# Patient Record
Sex: Male | Born: 1980 | Hispanic: Yes | Smoking: Current every day smoker
Health system: Southern US, Community
[De-identification: ages and names within clinical notes are randomized; demographics above are authoritative.]

## PROBLEM LIST (undated history)

## (undated) ENCOUNTER — Ambulatory Visit: Payer: Self-pay | Source: Home / Self Care

## (undated) HISTORY — PX: NO PAST SURGERIES: SHX2092

---

## 2013-08-03 ENCOUNTER — Emergency Department: Payer: Self-pay | Admitting: Emergency Medicine

## 2013-08-05 ENCOUNTER — Emergency Department: Payer: Self-pay | Admitting: Internal Medicine

## 2013-08-10 ENCOUNTER — Emergency Department: Payer: Self-pay | Admitting: Emergency Medicine

## 2014-12-07 ENCOUNTER — Emergency Department: Payer: Self-pay

## 2014-12-07 ENCOUNTER — Emergency Department
Admission: EM | Admit: 2014-12-07 | Discharge: 2014-12-07 | Disposition: A | Discharge status: Home / Self Care | Payer: Self-pay | Attending: Emergency Medicine | Admitting: Emergency Medicine

## 2014-12-07 ENCOUNTER — Encounter: Payer: Self-pay | Admitting: *Deleted

## 2014-12-07 DIAGNOSIS — S39011A Strain of muscle, fascia and tendon of abdomen, initial encounter: Secondary | ICD-10-CM | POA: Insufficient documentation

## 2014-12-07 DIAGNOSIS — Y9289 Other specified places as the place of occurrence of the external cause: Secondary | ICD-10-CM | POA: Insufficient documentation

## 2014-12-07 DIAGNOSIS — X58XXXA Exposure to other specified factors, initial encounter: Secondary | ICD-10-CM | POA: Insufficient documentation

## 2014-12-07 DIAGNOSIS — Y9389 Activity, other specified: Secondary | ICD-10-CM | POA: Insufficient documentation

## 2014-12-07 DIAGNOSIS — R1030 Lower abdominal pain, unspecified: Secondary | ICD-10-CM

## 2014-12-07 DIAGNOSIS — Y998 Other external cause status: Secondary | ICD-10-CM | POA: Insufficient documentation

## 2014-12-07 DIAGNOSIS — T148XXA Other injury of unspecified body region, initial encounter: Secondary | ICD-10-CM

## 2014-12-07 DIAGNOSIS — K5731 Diverticulosis of large intestine without perforation or abscess with bleeding: Secondary | ICD-10-CM

## 2014-12-07 DIAGNOSIS — Z720 Tobacco use: Secondary | ICD-10-CM | POA: Insufficient documentation

## 2014-12-07 LAB — URINALYSIS COMPLETE WITH MICROSCOPIC (ARMC ONLY)
BILIRUBIN URINE: NEGATIVE
Bacteria, UA: NONE SEEN
Glucose, UA: 50 mg/dL — AB
HGB URINE DIPSTICK: NEGATIVE
KETONES UR: NEGATIVE mg/dL
Leukocytes, UA: NEGATIVE
Nitrite: NEGATIVE
PROTEIN: NEGATIVE mg/dL
RBC / HPF: NONE SEEN RBC/hpf (ref 0–5)
SPECIFIC GRAVITY, URINE: 1.009 (ref 1.005–1.030)
Squamous Epithelial / LPF: NONE SEEN
WBC, UA: NONE SEEN WBC/hpf (ref 0–5)
pH: 6 (ref 5.0–8.0)

## 2014-12-07 LAB — COMPREHENSIVE METABOLIC PANEL
ALBUMIN: 4.6 g/dL (ref 3.5–5.0)
ALK PHOS: 78 U/L (ref 38–126)
ALT: 78 U/L — AB (ref 17–63)
AST: 60 U/L — ABNORMAL HIGH (ref 15–41)
Anion gap: 8 (ref 5–15)
BILIRUBIN TOTAL: 0.6 mg/dL (ref 0.3–1.2)
BUN: 11 mg/dL (ref 6–20)
CALCIUM: 9.5 mg/dL (ref 8.9–10.3)
CO2: 25 mmol/L (ref 22–32)
CREATININE: 0.83 mg/dL (ref 0.61–1.24)
Chloride: 106 mmol/L (ref 101–111)
Glucose, Bld: 113 mg/dL — ABNORMAL HIGH (ref 65–99)
Potassium: 3.9 mmol/L (ref 3.5–5.1)
SODIUM: 139 mmol/L (ref 135–145)
TOTAL PROTEIN: 7.9 g/dL (ref 6.5–8.1)

## 2014-12-07 LAB — CBC WITH DIFFERENTIAL/PLATELET
Basophils Absolute: 0 10*3/uL (ref 0–0.1)
Basophils Relative: 1 %
EOS PCT: 5 %
Eosinophils Absolute: 0.3 10*3/uL (ref 0–0.7)
HCT: 44.5 % (ref 40.0–52.0)
HEMOGLOBIN: 15.6 g/dL (ref 13.0–18.0)
LYMPHS PCT: 38 %
Lymphs Abs: 2.5 10*3/uL (ref 1.0–3.6)
MCH: 32 pg (ref 26.0–34.0)
MCHC: 35.2 g/dL (ref 32.0–36.0)
MCV: 90.9 fL (ref 80.0–100.0)
Monocytes Absolute: 0.6 10*3/uL (ref 0.2–1.0)
Monocytes Relative: 9 %
Neutro Abs: 3.2 10*3/uL (ref 1.4–6.5)
Neutrophils Relative %: 47 %
Platelets: 253 10*3/uL (ref 150–440)
RBC: 4.89 MIL/uL (ref 4.40–5.90)
RDW: 12.1 % (ref 11.5–14.5)
WBC: 6.6 10*3/uL (ref 3.8–10.6)

## 2014-12-07 LAB — LIPASE, BLOOD: LIPASE: 38 U/L (ref 22–51)

## 2014-12-07 MED ORDER — SODIUM CHLORIDE 0.9 % IV BOLUS (SEPSIS)
1000.0000 mL | INTRAVENOUS | Status: AC
  Administered 2014-12-07: 1000 mL via INTRAVENOUS

## 2014-12-07 MED ORDER — IOHEXOL 240 MG/ML SOLN
25.0000 mL | Freq: Once | INTRAMUSCULAR | Status: AC | PRN
  Administered 2014-12-07: 25 mL via ORAL
  Filled 2014-12-07: qty 25

## 2014-12-07 MED ORDER — ONDANSETRON HCL 4 MG/2ML IJ SOLN
4.0000 mg | INTRAMUSCULAR | Status: AC
  Administered 2014-12-07: 4 mg via INTRAVENOUS
  Filled 2014-12-07: qty 2

## 2014-12-07 MED ORDER — IOHEXOL 300 MG/ML  SOLN
100.0000 mL | Freq: Once | INTRAMUSCULAR | Status: AC | PRN
  Administered 2014-12-07: 100 mL via INTRAVENOUS
  Filled 2014-12-07: qty 100

## 2014-12-07 MED ORDER — MORPHINE SULFATE 4 MG/ML IJ SOLN
4.0000 mg | Freq: Once | INTRAMUSCULAR | Status: AC
  Administered 2014-12-07: 4 mg via INTRAVENOUS
  Filled 2014-12-07: qty 1

## 2014-12-07 NOTE — ED Provider Notes (Signed)
[facility] Emergency Department Provider Note  ____________________________________________  Time seen: Approximately 3:49 PM  I have reviewed the triage vital signs and the nursing notes.   HISTORY  Chief Complaint Abdominal Pain  This patient speaks Spanish and the history and physical was done with the assistance of the hospital Spanish interpreter  HPI Samuel Fleming is a 34 y.o. male who presents with lower abdominal pain.He has had pain for about 2 weeks after lifting some heavy boxes at work.  He describes it primarily in his left flank in his lower abdomen.  It was gradual in onset, persistent, and a dull ache that is worsened by movement and straining.  It got significantly worse today, however, and is now described as sharp and stabbing and severe and located primarily in his lower abdomen on both sides but worse on the left.  Movement and straining make it worse and nothing makes it better.  He endorses recent constipation where he has to strain hard to get out his bowel movement has noted some blood on the toilet paper when he wipes but no blood inside the stool.  He has not had any difficulty urinating but he describes his urine is foul-smelling.  He has not had any urinary incontinence.  He denies any penile or testicular/scrotal pain.  He denies nausea/vomiting, chest pain, shortness of breath, fever/chills, and dysuria.   History reviewed. No pertinent past medical history.  There are no active problems to display for this patient.   No past surgical history on file.  No current outpatient prescriptions on file.  Allergies Review of patient's allergies indicates no known allergies.  No family history on file.  Social History History  Substance Use Topics  . Smoking status: Current Every Day Smoker  . Smokeless tobacco: Not on file  . Alcohol Use: No    Review of Systems Constitutional: No fever/chills Eyes: No visual  changes. ENT: No sore throat. Cardiovascular: Denies chest pain. Respiratory: Denies shortness of breath. Gastrointestinal: Lower abdominal pain worsen the left.  No nausea, no vomiting.  No diarrhea.  Occasional constipation. Genitourinary: Negative for dysuria.  Endorses foul-smelling urine Musculoskeletal: Left flank pain Skin: Negative for rash. Neurological: Negative for headaches, focal weakness or numbness.  10-point ROS otherwise negative.  ____________________________________________   PHYSICAL EXAM:  VITAL SIGNS: ED Triage Vitals  Enc Vitals Group     BP 12/07/14 1529 137/70 mmHg     Pulse Rate 12/07/14 1540 79     Resp 12/07/14 1529 20     Temp 12/07/14 1529 99.1 F (37.3 C)     Temp Source 12/07/14 1529 Oral     SpO2 12/07/14 1540 97 %     Weight 12/07/14 1529 170 lb (77.111 kg)     Height 12/07/14 1529  (1.676 m)     Head Cir --      Peak Flow --      Pain Score 12/07/14 1530 8     Pain Loc --      Pain Edu? --      Excl. in GC? --     Constitutional: Alert and oriented.  Appears uncomfortable but is in no acute distress. Eyes: Conjunctivae are normal. PERRL. EOMI. Head: Atraumatic. Nose: No congestion/rhinnorhea. Mouth/Throat: Mucous membranes are moist.  Oropharynx non-erythematous. Neck: No stridor.   Cardiovascular: Normal rate, regular rhythm. Grossly normal heart sounds.  Good peripheral circulation. Respiratory: Normal respiratory effort.  No retractions. Lungs CTAB. Gastrointestinal: Soft with  moderate tenderness to palpation of his left lower quadrant and suprapubic areas. No distention.  No rebound or guarding.  No abdominal bruits.  Moderate left CVA tenderness, not on the right.  Deferred rectal exam. Musculoskeletal: No lower extremity tenderness nor edema.  No joint effusions. Neurologic:  Normal speech and language. No gross focal neurologic deficits are appreciated.  Skin:  Skin is warm, dry and intact. No rash noted. Psychiatric:  Mood and affect are normal. Speech and behavior are normal.  ____________________________________________   LABS (all labs ordered are listed, but only abnormal results are displayed)  Labs Reviewed  URINALYSIS COMPLETEWITH MICROSCOPIC ([facility] ONLY) - Abnormal; Notable for the following:    Color, Urine STRAW (*)    APPearance CLEAR (*)    Glucose, UA 50 (*)    All other components within normal limits  COMPREHENSIVE METABOLIC PANEL - Abnormal; Notable for the following:    Glucose, Bld 113 (*)    AST 60 (*)    ALT 78 (*)    All other components within normal limits  CBC WITH DIFFERENTIAL/PLATELET  LIPASE, BLOOD   ____________________________________________  EKG  Not indicated ____________________________________________  RADIOLOGY  Ct Abdomen Pelvis W Contrast  12/07/2014   CLINICAL DATA:  34 year old male with heavy lifting 2 weeks ago and subsequent pain radiating to the bilateral lower abdomen. Rectal bleeding with bowel movements and foul smelling urine. Lower abdominal pain. Initial encounter.  EXAM: CT ABDOMEN AND PELVIS WITH CONTRAST  TECHNIQUE: Multidetector CT imaging of the abdomen and pelvis was performed using the standard protocol following bolus administration of intravenous contrast.  CONTRAST:  OMNIPAQUE IOHEXOL 300 MG/ML  SOLN  COMPARISON:  None.  FINDINGS: Negative lung bases aside from mild scarring in the medial segment of the right middle lobe (series 4, image 13).  No pericardial or pleural effusion.  No acute osseous abnormality identified. Vacuum disc phenomena in the lower thoracic spine. Mild lower lumbar disc bulging.  No pelvic free fluid. Decompressed distal colon. Unremarkable urinary bladder. No perivesical stranding.  Decompressed sigmoid colon and left colon. Occasional left colon diverticula. Negative transverse colon. There are some diverticula at the hepatic flexure. There is also mild to moderate diverticulosis of the right colon. No  associated inflammation. Negative appendix. Negative terminal ileum. No dilated small bowel. Negative stomach and duodenum. Oral contrast has not yet reached the mid small bowel.  Mildly decreased density in the liver. Otherwise liver, gallbladder, spleen, pancreas, and adrenal glands are within normal limits. Portal venous system is patent. Major arterial structures are patent and appear normal. No abdominal free fluid. No lymphadenopathy. Both kidneys appear normal. No para renal stranding. Negative course of both ureters. No periureteral stranding. Left hemipelvis phlebolith.  IMPRESSION: Right greater than left large bowel diverticulosis with no active inflammation.  Normal appendix. No acute or inflammatory process identified in the abdomen or pelvis.   Electronically Signed   By: Odessa Fleming M.D.   On: 12/07/2014 17:00    ____________________________________________   PROCEDURES  Procedure(s) performed: None  Critical Care performed: No ____________________________________________   INITIAL IMPRESSION / ASSESSMENT AND PLAN / ED COURSE  Pertinent labs & imaging results that were available during my care of the patient were reviewed by me and considered in my medical decision making (see chart for details).  The patient is young for diverticulitis, but his history and presentation is most consistent with this diagnosis.  Hernia is also possible but not present on physical exam.  He may have  strained his back at work, but this does not explain his abdominal tenderness.  He has no neurological deficits, and in spite of some recent constipation, he has no evidence of cauda equina syndrome.  He is ambulating without difficulty and has no saddle anesthesia, urinary retention nor incontinence.  His back pain is in the left lateral flank.  Nephrolithiasis or ureterolithiasis is also possible.  Given his significant tenderness to palpation, I will evaluate with a CT scan with contrast.  I am giving him  some IV fluids, morphine, and Zofran.  I discussed this plan with the patient and his wife and they understand and agree with the plan.  ----------------------------------------- 5:43 PM on 12/07/2014 -----------------------------------------  The patient's workup is unremarkable.  He has diverticulosis on the CT scan but no evidence of diverticulitis.  Given the diverticulosis on the scan I deferred a rectal exam.  He has no other acute findings and his lab work is all unremarkable.  He states that he is feeling better after the IV fluids, morphine, and Zofran.  He has wife seem reassured by my report of his results.  I also explained that I personally reviewed the images of the CT scan and looked at his spine and also saw no evidence of acute abnormalities in his spine.  Explained he is likely suffering from some musculoskeletal strain due to the heavy lifting he does at work.  I advised him to establish a primary care doctor but return to the emergency department if he develops new or worsening symptoms that concern him.  He and his wife understand and agree.  ____________________________________________  FINAL CLINICAL IMPRESSION(S) / ED DIAGNOSES  Final diagnoses:  Muscle strain  Lower abdominal pain  Diverticulosis large intestine w/o perforation or abscess w/bleeding      NEW MEDICATIONS STARTED DURING THIS VISIT:  New Prescriptions   No medications on file     Loleta Roseory Tulio Facundo, MD 12/07/14 1747

## 2014-12-07 NOTE — ED Notes (Signed)
Per Interpreter patient lifting heavy boxes at work 15 days ago and has pain since then to right and left lower abdomen.  Per interpreter patient is having bleeding per rectum when having bowel movement and his urine smells bad when urinating.

## 2014-12-07 NOTE — ED Notes (Addendum)
Pt reports he has low abd pain.  No n/v/d.  Pt states it feels like a body aches.  Sx began 15 days, pain worse today.   Pt also reports lower back pain.  No diff urinating. Interpreter in with pt in triage.

## 2014-12-07 NOTE — Discharge Instructions (Signed)
You have been seen in the Emergency Department (ED) for abdominal pain.  Your evaluation did not identify a clear cause of your symptoms but was generally reassuring.  Please follow up as instructed above regarding todays emergent visit and the symptoms that are bothering you.  Return to the ED if your abdominal pain worsens or fails to improve, you develop bloody vomiting, bloody diarrhea, you are unable to tolerate fluids due to vomiting, fever greater than 101, or other symptoms that concern you.   Distensin muscular. (Muscle Strain) Una distensin muscular es una lesin que se produce cuando un msculo se estira ms all de su largo normal. Cuando esto sucede, por lo general se desgarra un pequeo nmero de fibras musculares. La distensin muscular se califica en grados. Las distensiones de Museum/gallery conservatorprimer grado son aquellas en las cuales el desgarro y el dolor afectan a la menor cantidad de fibras musculares. Las distensiones de segundo y tercer grado involucran una proporcin cada vez mayor de desgarro y Engineer, miningdolor.  En general, la recuperacin de una distensin muscular tarda de 1 a 2semanas. La curacin completa tarda de 5 a 6semanas.  CAUSAS  Las distensiones musculares ocurren cuando se aplica una fuerza violenta y repentina sobre un msculo y este se estira demasiado. Esto puede ocurrir cuando se Hydrographic surveyorlevantan objetos, se practican deportes o en una cada.  FACTORES DE RIESGO La distensin muscular es especialmente comn en los atletas.  SIGNOS Y SNTOMAS En el lugar de la distensin muscular se puede presentar lo siguiente:  Dolor.  Moretones.  Hinchazn.  Dificultad para usar el msculo debido al dolor o a un funcionamiento anormal. DIAGNSTICO  El mdico le har un examen fsico y le har preguntas sobre sus antecedentes mdicos. TRATAMIENTO  Con frecuencia, el mejor tratamiento para una distensin muscular es el reposo, y la aplicacin de hielo y de compresas fras en la zona de la  lesin.  INSTRUCCIONES PARA EL CUIDADO EN EL HOGAR   Use el mtodo PRICE (por sus siglas en ingls) de tratamiento para estimular la curacin durante los primeros 2 a 3das posteriores a la lesin. El mtodo PRICE implica lo siguiente:  Proteger al msculo de nuevas lesiones.  Limitar la actividad y Lawyerdescansar la parte del cuerpo lesionada.  Aplicar hielo a la lesin. Para hacerlo, ponga hielo en una bolsa plstica. Coloque una toalla entre la piel y la bolsa de hielo. Luego aplique el hielo y djelo actuar de 15 a 20minutos por hora. Despus del Press photographertercer da, cambie a compresas de calor hmedo.  Comprimir la zona lesionada con una frula o venda elstica. Tenga cuidado de no ajustarla demasiado. Esto puede interferir con la circulacin sangunea o aumentar la hinchazn.  Mantener la zona lesionada por encima del nivel del corazn con la mayor frecuencia posible.  Utilice los medicamentos de venta libre o recetados para Primary school teachercalmar el dolor, el malestar o la fiebre, segn se lo indique el mdico.  Education officer, environmentalealizar un calentamiento antes de hacer ejercicio ayuda a prevenir distensiones musculares futuras. SOLICITE ATENCIN MDICA SI:   Siente un dolor cada vez ms intenso o hinchazn en la zona lesionada.  Siente adormecimiento, hormigueo o nota una prdida importante de fuerza en la zona lesionada. ASEGRESE DE QUE:   Comprende estas instrucciones.  Controlar su afeccin.  Recibir ayuda de inmediato si no mejora o si empeora. Document Released: 02/13/2005 Document Revised: 02/24/2013 [employer] Patient Information 2015 [place], [place]. This information is not intended to replace advice given to you by your health care  provider. Make sure you discuss any questions you have with your health care provider.  Diverticulosis (Diverticulosis) La diverticulosis es una enfermedad que aparece cuando se forman pequeos bolsillos (divertculos) en las paredes del colon. El colon, o intestino grueso, es el  lugar donde se absorbe agua y se forman las heces. Los bolsillos se forman cuando la capa interna del colon ejerce presin sobre los puntos dbiles de las capas externas. CAUSAS  Nadie sabe con exactitud qu causa la diverticulosis. FACTORES DE RIESGO  Ser mayor de 76aos. El riesgo de desarrollar esta enfermedad aumenta con la edad. La diverticulosis es poco frecuente en las personas menores de [place]. A los 80aos, casi todas las personas tienen la enfermedad.  Comer una dieta con bajo contenido de [place].  Estar estreido con frecuencia.  Tener sobrepeso.  No hacer suficiente ejercicio fsico.  Fumar.  Tomar analgsicos de [address], como aspirina e ibuprofeno. SNTOMAS  La mayora de las personas que tienen diverticulosis no presentan sntomas. DIAGNSTICO  Dado que la diverticulosis no suele causar sntomas, los mdicos a menudo descubren la enfermedad durante un examen de otros problemas de colon. En muchos casos, el mdico diagnosticar la diverticulosis mientras utiliza un endoscopio flexible para examinar el colon (colonoscopa). TRATAMIENTO  Si nunca tuvo una infeccin relacionada con la diverticulosis, es posible que no necesite tratamiento. Si ha tenido una infeccin antes, el tratamiento puede incluir:  Comer ms frutas, verduras y cereales.  Tomar un suplemento de [place].  Tomar un suplemento de bacterias vivas (probitico).  Tomar medicamentos para relajar el colon. INSTRUCCIONES PARA EL CUIDADO EN EL HOGAR   Beba por lo menos entre 6 y 8vasos de agua por da para Banker.  Trate de no hacer fuerza al mover el intestino.  Cumpla con todas las visitas de control. Si ha tenido una infeccin antes:   Aumente la cantidad de fibra en la dieta, segn las indicaciones del mdico o del nutricionista.  Tome un suplemento dietario con fibras si el mdico lo autoriza.  Tome los medicamentos solamente como se lo haya indicado el mdico. SOLICITE  ATENCIN MDICA SI:   Siente dolor abdominal.  Tiene meteorismo.  Tiene clicos.  No ha defecado en 3das. SOLICITE ATENCIN MDICA DE INMEDIATO SI:   El dolor empeora.  El meteorismo [employer].  Tiene fiebre o escalofros, y los sntomas empeoran repentinamente.  Comienza a vomitar.  La materia fecal es sanguinolenta o negra. ASEGRESE DE QUE:  Comprende estas instrucciones.  Controlar su afeccin.  Recibir ayuda de inmediato si no mejora o si empeora. Document Released: 04/18/2008 Document Revised: 05/11/2013 [facility] Patient Information 2015 [place], [place]. This information is not intended to replace advice given to you by your health care provider. Make sure you discuss any questions you have with your health care provider.

## 2018-02-16 LAB — HM HIV SCREENING LAB: HM HIV Screening: NEGATIVE

## 2019-09-23 ENCOUNTER — Ambulatory Visit: Payer: Self-pay | Admitting: Family Medicine

## 2019-09-23 ENCOUNTER — Other Ambulatory Visit: Payer: Self-pay

## 2019-09-23 ENCOUNTER — Encounter: Payer: Self-pay | Admitting: Family Medicine

## 2019-09-23 DIAGNOSIS — N341 Nonspecific urethritis: Secondary | ICD-10-CM

## 2019-09-23 DIAGNOSIS — L918 Other hypertrophic disorders of the skin: Secondary | ICD-10-CM

## 2019-09-23 DIAGNOSIS — Z113 Encounter for screening for infections with a predominantly sexual mode of transmission: Secondary | ICD-10-CM

## 2019-09-23 LAB — GRAM STAIN

## 2019-09-23 MED ORDER — AZITHROMYCIN 500 MG PO TABS
1000.0000 mg | ORAL_TABLET | Freq: Once | ORAL | Status: AC
  Administered 2019-09-23: 10:00:00 1000 mg via ORAL

## 2019-09-23 NOTE — Progress Notes (Signed)
Gram stain reviewed; pt treated for NGU per provider diagnosis and order. Provider orders completed. Instructions reviewed with pt about pt and partner tx.

## 2019-09-23 NOTE — Progress Notes (Signed)
[facility] Department STI clinic/screening visit  Subjective:  Samuel Fleming is a 39 y.o. male being seen today for  Chief Complaint  Patient presents with  . SEXUALLY TRANSMITTED DISEASE    STD screening including bloodwork     The patient reports they do have symptoms.   Patient has the following medical conditions:  There are no problems to display for this patient.   HPI  Pt reports lower abd pain, burning w/urination and fever x1 wk. Past 2 days he took 2 pills of penicillin, possibly helped fever. Had a negative covid test. Pt urinated ~1 hr ago.  Also reports reoccurrence of skin growths on hips, has had these frozen in the past, requests freezing again today.    See flowsheet for further details and programmatic requirements.    No components found for: HCV  The following portions of the patient's history were reviewed and updated as appropriate: allergies, current medications, past medical history, past social history, past surgical history and problem list.  Objective:  There were no vitals filed for this visit.   Physical Exam Constitutional:      Appearance: Normal appearance.  HENT:     Head: Normocephalic and atraumatic.     Comments: No nits or hair loss    Mouth/Throat:     Mouth: Mucous membranes are moist.     Pharynx: Oropharynx is clear. No oropharyngeal exudate or posterior oropharyngeal erythema.  Pulmonary:     Effort: Pulmonary effort is normal.  Abdominal:     General: Abdomen is flat.     Palpations: Abdomen is soft. There is no hepatomegaly or mass.     Tenderness: There is no abdominal tenderness.  Genitourinary:    Pubic Area: No rash or pubic lice.      Penis: Normal and uncircumcised.      Testes: Normal.     Epididymis:     Right: Normal.     Left: Normal.     Rectum: Normal.    Lymphadenopathy:     Head:     Right side of head: No preauricular or posterior auricular adenopathy.     Left side of head: No  preauricular or posterior auricular adenopathy.     Cervical: No cervical adenopathy.     Upper Body:     Right upper body: No supraclavicular or axillary adenopathy.     Left upper body: No supraclavicular or axillary adenopathy.     Lower Body: No right inguinal adenopathy. No left inguinal adenopathy.  Skin:    General: Skin is warm and dry.     Findings: No rash.  Neurological:     Mental Status: He is alert and oriented to person, place, and time.       Assessment and Plan:  Samuel Fleming is a 39 y.o. male presenting to the [facility] Department for STI screening    1. Screening examination for venereal disease -Pt with symptoms. Screenings today as below. Treat wet prep per standing order. -Patient does not meet criteria for HepB, HepC Screening.  -Counseled on warning s/sx and when to seek care. Recommended condom use with all sex and discussed importance of condom use for STI prevention. - Gram stain - HIV [place] [facility] - Syphilis Serology, [facility] - Gonococcus culture  2. NGU (nongonococcal urethritis) - presumptive -Will treat symptoms presumptively as NGU today.  -Pt counseled regarding medication, including to RTC if vomits < 2 hr after  taking medicine. No known allergies to this medication. -Advised no sex for 7 days after both pt and partner completes treatment and encouraged condoms with all sex. - azithromycin (ZITHROMAX) tablet 1,000 mg  3. Skin tag -Cryo treatment in 3 freeze/thaw cycles today.  Patient tolerated procedure well. -Reviewed with patient after-care instructions and when to call clinic. -No sex until area has completely healed. -RTC in 10-14 days for next treatment if needed.    Return if symptoms worsen or fail to improve.  No future appointments.  Ann Held, PA-C

## 2019-09-27 LAB — GONOCOCCUS CULTURE

## 2020-09-22 ENCOUNTER — Other Ambulatory Visit: Payer: Self-pay

## 2020-09-22 ENCOUNTER — Ambulatory Visit
Admission: EM | Admit: 2020-09-22 | Discharge: 2020-09-22 | Disposition: A | Discharge status: Home / Self Care | Payer: Self-pay | Attending: Emergency Medicine | Admitting: Emergency Medicine

## 2020-09-22 DIAGNOSIS — U071 COVID-19: Secondary | ICD-10-CM | POA: Insufficient documentation

## 2020-09-22 DIAGNOSIS — R509 Fever, unspecified: Secondary | ICD-10-CM | POA: Insufficient documentation

## 2020-09-22 LAB — COMPREHENSIVE METABOLIC PANEL
ALT: 154 U/L — ABNORMAL HIGH (ref 0–44)
AST: 88 U/L — ABNORMAL HIGH (ref 15–41)
Albumin: 4.9 g/dL (ref 3.5–5.0)
Alkaline Phosphatase: 82 U/L (ref 38–126)
Anion gap: 5 (ref 5–15)
BUN: 10 mg/dL (ref 6–20)
CO2: 28 mmol/L (ref 22–32)
Calcium: 9.4 mg/dL (ref 8.9–10.3)
Chloride: 104 mmol/L (ref 98–111)
Creatinine, Ser: 0.77 mg/dL (ref 0.61–1.24)
GFR, Estimated: >60 mL/min (ref >60)
Glucose, Bld: 102 mg/dL — ABNORMAL HIGH (ref 70–99)
Potassium: 4.3 mmol/L (ref 3.5–5.1)
Sodium: 137 mmol/L (ref 135–145)
Total Bilirubin: 0.5 mg/dL (ref 0.3–1.2)
Total Protein: 8.4 g/dL — ABNORMAL HIGH (ref 6.5–8.1)

## 2020-09-22 LAB — CBC WITH DIFFERENTIAL/PLATELET
Abs Immature Granulocytes: 0.01 10*3/uL (ref 0.00–0.07)
Basophils Absolute: 0 10*3/uL (ref 0.0–0.1)
Basophils Relative: 1 %
Eosinophils Absolute: 0.1 10*3/uL (ref 0.0–0.5)
Eosinophils Relative: 1 %
HCT: 48.4 % (ref 39.0–52.0)
Hemoglobin: 16.5 g/dL (ref 13.0–17.0)
Immature Granulocytes: 0 %
Lymphocytes Relative: 35 %
Lymphs Abs: 1.6 10*3/uL (ref 0.7–4.0)
MCH: 31.6 pg (ref 26.0–34.0)
MCHC: 34.1 g/dL (ref 30.0–36.0)
MCV: 92.7 fL (ref 80.0–100.0)
Monocytes Absolute: 0.7 10*3/uL (ref 0.1–1.0)
Monocytes Relative: 15 %
Neutro Abs: 2.2 10*3/uL (ref 1.7–7.7)
Neutrophils Relative %: 48 %
Platelets: 214 10*3/uL (ref 150–400)
RBC: 5.22 MIL/uL (ref 4.22–5.81)
RDW: 11.8 % (ref 11.5–15.5)
WBC: 4.5 10*3/uL (ref 4.0–10.5)
nRBC: 0 % (ref 0.0–0.2)

## 2020-09-22 LAB — RAPID INFLUENZA A&B ANTIGENS
Influenza A (ARMC): NEGATIVE
Influenza B (ARMC): NEGATIVE

## 2020-09-22 LAB — OCCULT BLOOD X 1 CARD TO LAB, STOOL: Fecal Occult Bld: NEGATIVE

## 2020-09-22 NOTE — ED Provider Notes (Signed)
[facility]    CSN: [id number] Arrival date & time: 09/22/20  0948      History   Chief Complaint Chief Complaint  Patient presents with  . Fever    HPI Samuel Fleming is a 40 y.o. male.   HPI   40 year old Spanish-speaking male here for evaluation of fever.  Patient reports that he has been experiencing a subjective fever for the past 4 days and also sweats.  He has not measured his temperature at home.  Additionally, patient is complaining of pain in his upper back.  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  Patient is also concerned that he might have hemorrhoids because he has bleeding every time he has a bowel movement and he states he feels like something comes out that he has to push back in.  He states that he does have to strain significantly to have a bowel movement and that his stool comes out and strips but is otherwise normal consistency.  There is no history of colon cancer in the family.  Patient denies runny nose, sore throat, sinus pain or pressure, cough, shortness of breath or wheezing, abdominal pain, nausea, vomiting, diarrhea, or constipation.  Patient denies any urinary signs or symptoms.  Patient denies any blood in his urine or rectal itching.  Spanish interpreter Madaline Guthrie [phone] used for HPI and assessment.  History reviewed. No pertinent past medical history.  There are no problems to display for this patient.   Past Surgical History:  Procedure Laterality Date  . NO PAST SURGERIES         Home Medications    Prior to Admission medications   Not on File    Family History Family History  Problem Relation Age of Onset  . Healthy Mother   . Prostate cancer Father     Social History Social History   Tobacco Use  . Smoking status: Current Every Day Smoker    Packs/day: 0.25  . Smokeless tobacco: Current User   Vaping Use  . Vaping Use: Some days  Substance Use Topics  . Alcohol use: Yes    Comment: occasionally  . Drug use: Never     Allergies   Patient has no known allergies.   Review of Systems Review of Systems  Constitutional: Positive for fever. Negative for activity change and appetite change.  HENT: Negative for ear pain, rhinorrhea, sinus pressure and sore throat.   Respiratory: Negative for cough, shortness of breath and wheezing.   Gastrointestinal: Positive for anal bleeding. Negative for abdominal pain, constipation, diarrhea, nausea, rectal pain and vomiting.  Genitourinary: Negative for dysuria, frequency, hematuria and urgency.  Musculoskeletal: Positive for back pain.  Skin: Negative for rash.  Neurological: Negative for headaches.  Psychiatric/Behavioral: Negative.      Physical Exam Triage Vital Signs ED Triage Vitals [09/22/20 1005]  Enc Vitals Group     BP 135/84     Pulse Rate 80     Resp 18     Temp 99.4 F (37.4 C)     Temp Source Oral     SpO2 100 %     Weight 189 lb (85.7 kg)     Height      Head Circumference      Peak Flow      Pain Score 8     Pain Loc  Pain Edu?      Excl. in GC?    No data found.  Updated Vital Signs BP 135/84 (BP Location: Left Arm)   Pulse 80   Temp 99.4 F (37.4 C) (Oral)   Resp 18   Wt 189 lb (85.7 kg)   SpO2 100%   BMI 30.51 kg/m   Visual Acuity Right Eye Distance:   Left Eye Distance:   Bilateral Distance:    Right Eye Near:   Left Eye Near:    Bilateral Near:     Physical Exam Vitals and nursing note reviewed.  Constitutional:      General: He is not in acute distress.    Appearance: Normal appearance. He is normal weight.  HENT:     Head: Normocephalic and atraumatic.     Right Ear: Tympanic membrane, ear canal and external ear normal. There is no impacted cerumen.     Left Ear: Tympanic membrane, ear canal and external ear normal.     Nose: Congestion present.     Mouth/Throat:      Mouth: Mucous membranes are moist.     Pharynx: Oropharynx is clear. No oropharyngeal exudate or posterior oropharyngeal erythema.  Cardiovascular:     Rate and Rhythm: Normal rate and regular rhythm.     Pulses: Normal pulses.     Heart sounds: Normal heart sounds. No murmur heard. No gallop.   Pulmonary:     Effort: Pulmonary effort is normal.     Breath sounds: Normal breath sounds. No wheezing, rhonchi or rales.  Genitourinary:    Prostate: Normal.     Rectum: Normal.  Musculoskeletal:     Cervical back: Normal range of motion and neck supple.  Lymphadenopathy:     Cervical: No cervical adenopathy.  Skin:    General: Skin is warm and dry.     Capillary Refill: Capillary refill takes less than 2 seconds.     Findings: No erythema or rash.  Neurological:     General: No focal deficit present.     Mental Status: He is alert and oriented to person, place, and time.  Psychiatric:        Mood and Affect: Mood normal.        Behavior: Behavior normal.        Thought Content: Thought content normal.        Judgment: Judgment normal.      UC Treatments / Results  Labs (all labs ordered are listed, but only abnormal results are displayed) Labs Reviewed  COMPREHENSIVE METABOLIC PANEL - Abnormal; Notable for the following components:      Result Value   Glucose, Bld 102 (*)    Total Protein 8.4 (*)    AST 88 (*)    ALT 154 (*)    All other components within normal limits  RAPID INFLUENZA A&B ANTIGENS  SARS CORONAVIRUS 2 (TAT 6-24 HRS)  CBC WITH DIFFERENTIAL/PLATELET  OCCULT BLOOD X 1 CARD TO LAB, STOOL    EKG   Radiology No results found.  Procedures Procedures (including critical care time)  Medications Ordered in UC Medications - No data to display  Initial Impression / Assessment and Plan / UC Course  I have reviewed the triage vital signs and the nursing notes.  Pertinent labs & imaging results that were available during my care of the patient were  reviewed by me and considered in my medical decision making (see chart for details).   Patient is a very pleasant, nontoxic-appearing  40 year old Spanish-speaking male here for evaluation of subjective fever and upper back pain for the last 4 days.  Patient reports that his pain is in the upper part of his shoulders but he denies cough, shortness breath, or wheezing.  Patient denies any URI, GI, or genitourinary symptoms.  Patient is concerned because he cleans cars for living and recently had to clean the car that contained bags of feces that he touched.  Patient states that he washes hands afterwards.  He just wants to get checked out to make sure that he does not have any fecal infections.  Additionally, patient reports that he has been having rectal bleeding every time he has a bowel movement and that he feels something coming out of his rectum that he has to push back in with toilet paper.  He states that he has to strain to have a bowel movement and that his stool is in strips but is of normal consistency.  It is unclear, even with the use of mesh interpreter, how long he has had these symptoms.  Patient denies any abdominal pain, nausea, vomiting, diarrhea, or constipation.  Patient's physical exam reveals pearly gray tympanic membranes bilaterally with normal light reflex and clear external auditory canals.  Nasal mucosa is erythematous and edematous with scant clear nasal discharge.  Oropharyngeal exam is unremarkable and patient has no cervical lymphadenopathy appreciated on exam.  Cardiopulmonary exam is benign.  Abdomen is soft, nontender, nondistended.  Patient's back exam does reveal mild tenderness without overt spasm in the upper traps bilaterally.  No spinal tenderness.  Patient's rectal exam reveals the no external hemorrhoids present and there is no evidence of dried blood around the rectum.  DRE is negative for gross blood and there are no masses palpated in the distal rectum and no internal  hemorrhoids palpated.  Prostate is normal in texture, size, and is nontender.  Given patient's subjective fever and sweats we will check patient for COVID, flu, and obtain CBC and CMP.  Patient describes what sounds like a rectal prolapse but there is no evidence of prolapse on physical exam.  Will refer patient to GI for further evaluation work-up.  CBC is unremarkable.  Occult blood is negative.  Rapid influenza is negative.  COVID test is pending.  CMP reveals the elevation of AST and ALT with an elevation of total protein.  Albumin, renal function, calcium, and electrolytes are all normal.  Will discharge patient home to isolate pending the results of his COVID test.  We will also send a referral to GI for further evaluation of possible rectal prolapse.  Work-up provided.   Final Clinical Impressions(s) / UC Diagnoses   Final diagnoses:  Fever, unspecified fever cause     Discharge Instructions     Use over-the-counter ibuprofen as needed for fever and back pain.  Isolate at home until the results of your COVID test are back.  If you test positive you have to quarantine for 5 days from your symptoms started.  I have referred you to [facility] for further evaluation of your rectal complaints.  Return for reevaluation, or go to the ER, for any new or worsening symptoms.    ED Prescriptions    None     PDMP not reviewed this encounter.   Becky Augusta, NP 09/22/20 1215

## 2020-09-22 NOTE — ED Triage Notes (Signed)
Patient states that he is here due to fever x 4 days. 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 Denies cough, congestion or headache. States that he does have pain in his back where his lungs are.

## 2020-09-22 NOTE — Discharge Instructions (Addendum)
Use over-the-counter ibuprofen as needed for fever and back pain.  Isolate at home until the results of your COVID test are back.  If you test positive you have to quarantine for 5 days from your symptoms started.  I have referred you to [facility] for further evaluation of your rectal complaints.  Return for reevaluation, or go to the ER, for any new or worsening symptoms.

## 2020-09-23 LAB — SARS CORONAVIRUS 2 (TAT 6-24 HRS): SARS Coronavirus 2: POSITIVE — AB

## 2020-09-24 ENCOUNTER — Telehealth: Payer: Self-pay | Admitting: Nurse Practitioner

## 2020-09-24 NOTE — Telephone Encounter (Signed)
Using a Spanish interpreter, I called patient to discuss Covid symptoms and the use of bebtelovimab, a monoclonal antibody infusion, or antiviral medication therapy for those with mild to moderate Covid symptoms and at a high risk of hospitalization.  Pt is qualified for treatment due to; Specific high risk criteria : Other high risk medical condition per CDC:  high risk population   Message left to call back our hotline [phone]  Nicolasa Ducking, NP

## 2020-09-27 ENCOUNTER — Encounter: Payer: Self-pay | Admitting: *Deleted

## 2020-11-15 LAB — HM DIABETES EYE EXAM

## 2020-11-21 ENCOUNTER — Ambulatory Visit: Payer: Self-pay | Admitting: Physician Assistant

## 2020-11-21 ENCOUNTER — Other Ambulatory Visit: Payer: Self-pay

## 2020-11-21 DIAGNOSIS — N451 Epididymitis: Secondary | ICD-10-CM

## 2020-11-21 DIAGNOSIS — Z113 Encounter for screening for infections with a predominantly sexual mode of transmission: Secondary | ICD-10-CM

## 2020-11-21 MED ORDER — CEFTRIAXONE SODIUM 500 MG IJ SOLR
500.0000 mg | Freq: Once | INTRAMUSCULAR | Status: AC
  Administered 2020-11-21: 500 mg via INTRAMUSCULAR

## 2020-11-21 MED ORDER — DOXYCYCLINE HYCLATE 100 MG PO TABS
100.0000 mg | ORAL_TABLET | Freq: Two times a day (BID) | ORAL | 0 refills | Status: AC
Start: 2020-11-21 — End: 2020-12-05

## 2020-11-21 NOTE — Progress Notes (Signed)
Pt here for STD screening.  Medication dispensed per Provider orders.  Ceftriaxone 500 mg given IM in [facility]OQ without any complications. Condoms given.  Berdie Ogren, RN

## 2020-11-22 ENCOUNTER — Encounter: Payer: Self-pay | Admitting: Physician Assistant

## 2020-11-22 NOTE — Progress Notes (Signed)
[facility] Department STI clinic/screening visit  Subjective:  Samuel Fleming is a 40 y.o. male being seen today for an STI screening visit. The patient reports they do have symptoms.    Patient has the following medical conditions:  There are no problems to display for this patient.    Chief Complaint  Patient presents with   SEXUALLY TRANSMITTED DISEASE    screening    HPI  Patient reports that he has had "pain in my groin and testicles" for 1 week.  Patient reports that the pain comes and goes and is worse at night.  Patient points to suprapubic area as a place for pain.  Denies changes to bowel or bladder habits, straining, split stream or feeling of not complete emptying after urination. Denies nausea, vomiting, fever, chills, back or flank pain.   Reports he feels that his urine has an odor.   See flowsheet for further details and programmatic requirements.    The following portions of the patient's history were reviewed and updated as appropriate: allergies, current medications, past medical history, past social history, past surgical history and problem list.  Objective:  There were no vitals filed for this visit.  Physical Exam Constitutional:      General: He is not in acute distress.    Appearance: Normal appearance.  HENT:     Head: Normocephalic and atraumatic.     Comments: No nits,lice, or hair loss. No cervical, supraclavicular or axillary adenopathy.     Mouth/Throat:     Mouth: Mucous membranes are moist.     Pharynx: Oropharynx is clear. No oropharyngeal exudate or posterior oropharyngeal erythema.  Eyes:     Conjunctiva/sclera: Conjunctivae normal.  Pulmonary:     Effort: Pulmonary effort is normal.  Abdominal:     Palpations: Abdomen is soft. There is no mass.     Tenderness: There is no abdominal tenderness. There is no guarding or rebound.  Genitourinary:    Penis: Normal.      Testes: Normal.     Comments: Pubic area  without nits, lice, hair loss, edema, erythema, lesions and inguinal adenopathy. Penis uncircumcised without rash, lesions and discharge at meatus. Testicles descended bilaterally,no masses or edema.  Bilateral testicular pain on palpation. No CVAT. Musculoskeletal:     Cervical back: Neck supple. No tenderness.  Skin:    General: Skin is warm and dry.     Findings: No bruising, erythema, lesion or rash.  Neurological:     Mental Status: He is alert and oriented to person, place, and time.  Psychiatric:        Mood and Affect: Mood normal.        Behavior: Behavior normal.        Thought Content: Thought content normal.        Judgment: Judgment normal.      Assessment and Plan:  Samuel Fleming is a 40 y.o. male presenting to the [facility] Department for STI screening  1. Screening for STD (sexually transmitted disease) Patient into clinic with symptoms. Counseled patient on limitations of testing at [facility] and that he should establish with PCP for further evaluation if symptoms persist or reappear after evaluation and treatment here. PCP list given to patient today. Patient declines blood work today.  Rec condoms with all sex. Await test results.  Counseled that RN will call if needs to RTC for treatment once results are back.  - Chlamydia/Gonorrhea [facility]  2. Epididymitis Will treat for likely epididymitis with Ceftriaxone 500 mg IM and Doxycycline 100 mg #28 1 po BID for 14 days. No sex for 14 days and until after partner completes evaluation and  treatment. Call with questions or concerns.  - cefTRIAXone (ROCEPHIN) injection 500 mg - doxycycline (VIBRA-TABS) 100 MG tablet; Take 1 tablet (100 mg total) by mouth 2 (two) times daily for 14 days.  Dispense: 28 tablet; Refill: 0     No follow-ups on file.  No future appointments.  Matt Holmes, PA

## 2021-03-14 ENCOUNTER — Ambulatory Visit: Payer: Self-pay | Admitting: Nurse Practitioner

## 2021-03-14 ENCOUNTER — Other Ambulatory Visit: Payer: Self-pay

## 2021-03-14 ENCOUNTER — Encounter: Payer: Self-pay | Admitting: Nurse Practitioner

## 2021-03-14 DIAGNOSIS — Z113 Encounter for screening for infections with a predominantly sexual mode of transmission: Secondary | ICD-10-CM

## 2021-03-14 DIAGNOSIS — N451 Epididymitis: Secondary | ICD-10-CM

## 2021-03-14 LAB — GRAM STAIN

## 2021-03-14 MED ORDER — DOXYCYCLINE HYCLATE 100 MG PO TABS: 100.0000 mg | ORAL_TABLET | Freq: Two times a day (BID) | ORAL | 0 refills | Status: DC

## 2021-03-14 MED ORDER — CEFTRIAXONE SODIUM 500 MG IJ SOLR
500.0000 mg | Freq: Once | INTRAMUSCULAR | Status: AC
  Administered 2021-03-14: 500 mg via INTRAMUSCULAR

## 2021-03-14 NOTE — Progress Notes (Signed)
[facility] Department STI clinic/screening visit  Subjective:  Samuel Fleming is a 40 y.o. male being seen today for an STI screening visit. The patient reports they do have symptoms.  Patient was here in July with reports of the same signs and symptoms. Patient states that pain suprapubic area and radiates to lower back.  Pain in testicles also noted.  Pain started 2-3 days ago.  No other signs and symptoms noted.  Patient reports no other injuries while lifting heavy objects or straining.    Patient has the following medical conditions:  There are no problems to display for this patient.    Chief Complaint  Patient presents with   STD screening     HPI  Patient reports to clinic today with a complaint of lower abdominal pain and some pain with urination.   Does the patient or their partner desires a pregnancy in the next year? No  Screening for MPX risk: Does the patient have an unexplained rash? No Is the patient MSM? No Does the patient endorse multiple sex partners or anonymous sex partners? No Did the patient have close or sexual contact with a person diagnosed with MPX? No Has the patient traveled outside the [place] where MPX is endemic? No Is there a high clinical suspicion for MPX-- evidenced by one of the following No  -Unlikely to be chickenpox  -Lymphadenopathy  -Rash that present in same phase of evolution on any given body part   See flowsheet for further details and programmatic requirements.    The following portions of the patient's history were reviewed and updated as appropriate: allergies, current medications, past medical history, past social history, past surgical history and problem list.  Objective:  There were no vitals filed for this visit.  Physical Exam Constitutional:      Appearance: Normal appearance.  HENT:     Head: Normocephalic and atraumatic.  Pulmonary:     Effort: Pulmonary effort is normal.  Genitourinary:     Penis: Normal.      Testes: Normal.     Comments: Pubic area without nits, lice, hair loss, edema, erythema, land lesions.  Left inguinal adenopathy. Penis is uncircumcised without rash, lesions and discharge at meatus. Testicles descended bilaterally,nt, no masses or edema.  Testicular pain noted with palpation.   Musculoskeletal:     Cervical back: Normal range of motion and neck supple.  Lymphadenopathy:     Cervical: No cervical adenopathy.  Skin:    General: Skin is warm and dry.  Neurological:     Mental Status: He is alert and oriented to person, place, and time.  Psychiatric:        Mood and Affect: Mood normal.        Behavior: Behavior normal.      Assessment and Plan:  Samuel Fleming is a 40 y.o. male presenting to the [facility] Department for STI screening  1. Screening examination for venereal disease Patient does have STI symptoms Patient accepted all screenings including  gonococcus culture.  Patient declined bloodwork today.  Patient meets criteria for HepB screening? Yes. Ordered? No - patient declined.  Patient meets criteria for HepC screening? Yes. Ordered? No - patient declined.  Recommended condom use with all sex Discussed importance of condom use for STI prevent  Gram stain reviewed.  Due to patient's signs and symptoms will treat patient today for Epididymitis.    Discussed time line for [facility] results and that  patient will be called with positive results and encouraged patient to call if he had not heard in 2 weeks  Recommended returning for continued or worsening symptoms.    - Gram stain - Gonococcus culture   2. Epididymitis Due to patient's signs and symptoms and physical exam.  Will treat patient today for epididymitis.  Encouraged patient to have a discussion with partner to come to clinic for an STD screening.   Also provided patient with PCP list to have a follow up regarding continuous signs and symptoms.    -  cefTRIAXone (ROCEPHIN) injection 500 mg - doxycycline (VIBRA-TABS) 100 MG tablet; Take 1 tablet (100 mg total) by mouth 2 (two) times daily for 14 days.  Dispense: 28 tablet; Refill: 0      No follow-ups on file.  No future appointments.  Glenna Fellows, NP

## 2021-03-14 NOTE — Progress Notes (Signed)
Patient seen by A.White, FNP.  FNP consulted me regarding patient history, exam findings and Gram stain results.  Recommend that patient be treated for epididymitis due to exam findings and that it be stressed that partner have exam/treatment and that patient follow up with PCP or Urology if symptoms recur after treatment.  Orders placed for patient receive treatment.

## 2021-03-18 LAB — GONOCOCCUS CULTURE

## 2021-03-21 ENCOUNTER — Ambulatory Visit
Admission: EM | Admit: 2021-03-21 | Discharge: 2021-03-21 | Disposition: A | Discharge status: Home / Self Care | Payer: Self-pay | Attending: Internal Medicine | Admitting: Internal Medicine

## 2021-03-21 ENCOUNTER — Encounter: Payer: Self-pay | Admitting: Licensed Clinical Social Worker

## 2021-03-21 ENCOUNTER — Other Ambulatory Visit: Payer: Self-pay

## 2021-03-21 ENCOUNTER — Ambulatory Visit (INDEPENDENT_AMBULATORY_CARE_PROVIDER_SITE_OTHER): Payer: Self-pay

## 2021-03-21 DIAGNOSIS — M545 Low back pain, unspecified: Secondary | ICD-10-CM

## 2021-03-21 DIAGNOSIS — S39012A Strain of muscle, fascia and tendon of lower back, initial encounter: Secondary | ICD-10-CM

## 2021-03-21 MED ORDER — CYCLOBENZAPRINE HCL 10 MG PO TABS: 10.0000 mg | ORAL_TABLET | Freq: Three times a day (TID) | ORAL | 0 refills | Status: DC

## 2021-03-21 MED ORDER — KETOROLAC TROMETHAMINE 60 MG/2ML IM SOLN
60.0000 mg | Freq: Once | INTRAMUSCULAR | Status: AC
  Administered 2021-03-21: 60 mg via INTRAMUSCULAR

## 2021-03-21 MED ORDER — PREDNISONE 10 MG (21) PO TBPK: ORAL_TABLET | Freq: Every day | ORAL | 0 refills | Status: DC

## 2021-03-21 MED ORDER — TRAMADOL HCL 50 MG PO TABS: 50.0000 mg | ORAL_TABLET | Freq: Four times a day (QID) | ORAL | 0 refills | Status: DC | PRN

## 2021-03-21 NOTE — ED Provider Notes (Addendum)
[facility]    CSN: [id number] Arrival date & time: 03/21/21  1024      History   Chief Complaint Chief Complaint  Patient presents with   Back Pain    HPI Samuel Fleming is a 40 y.o. male who presents with R lumbar back pain a couple of days after he jumped off a truck after washing it. His wife has been applying tiger bomb at bed time which helps him get some sleep. Pain is provoked with spine movement, worse with bending over. Today the pain is more severe. Does not radiate to his legs. Denies lower extremity numbness or weakness. Today had a hard time walking due to the pain. Denies past hx of injuring his back before.     History reviewed. No pertinent past medical history.  There are no problems to display for this patient.   Past Surgical History:  Procedure Laterality Date   NO PAST SURGERIES         Home Medications    Prior to Admission medications   Medication Sig Start Date End Date Taking? Authorizing Provider  cyclobenzaprine (FLEXERIL) 10 MG tablet Take 1 tablet (10 mg total) by mouth 3 (three) times daily. 03/21/21  Yes Rodriguez-Southworth, Nettie Elm, PA-C  predniSONE (STERAPRED UNI-PAK 21 TAB) 10 MG (21) TBPK tablet Take by mouth daily. Take 6 tabs by mouth daily  for 2 days, then 5 tabs for 2 days, then 4 tabs for 2 days, then 3 tabs for 2 days, 2 tabs for 2 days, then 1 tab by mouth daily for 2 days 03/21/21  Yes Rodriguez-Southworth, Nettie Elm, PA-C  traMADol (ULTRAM) 50 MG tablet Take 1 tablet (50 mg total) by mouth every 6 (six) hours as needed. 03/21/21  Yes Rodriguez-Southworth, Nettie Elm, PA-C    Family History Family History  Problem Relation Age of Onset   Healthy Mother    Prostate cancer Father     Social History Social History   Tobacco Use   Smoking status: Every Day    Packs/day: 0.60    Types: Cigarettes   Smokeless tobacco: Current  Vaping Use   Vaping Use: Never used  Substance Use Topics   Alcohol use: Yes     Comment: occasionally   Drug use: Not Currently    Types: Cocaine     Allergies   Patient has no known allergies.   Review of Systems Review of Systems  Constitutional:  Negative for chills, diaphoresis and fatigue.       Possibly subjective fever per his wife  HENT:  Negative for congestion.   Respiratory:  Negative for cough.   Gastrointestinal:  Negative for abdominal pain.  Genitourinary:  Negative for difficulty urinating, dysuria and frequency.  Musculoskeletal:  Positive for back pain and gait problem.  Skin:  Negative for color change, rash and wound.  Neurological:  Negative for weakness and numbness.    Physical Exam Triage Vital Signs ED Triage Vitals  Enc Vitals Group     BP 03/21/21 1111 126/74     Pulse Rate 03/21/21 1111 67     Resp 03/21/21 1111 16     Temp 03/21/21 1111 98.2 F (36.8 C)     Temp src --      SpO2 03/21/21 1111 99 %     Weight 03/21/21 1108 175 lb (79.4 kg)     Height 03/21/21 1108 5\' 6"  (1.676 m)     Head Circumference --      Peak  Flow --      Pain Score 03/21/21 1108 10     Pain Loc --      Pain Edu? --      Excl. in GC? --    No data found.  Updated Vital Signs BP 126/74 (BP Location: Left Arm)   Pulse 67   Temp 98.2 F (36.8 C)   Resp 16   Ht 5\' 6"  (1.676 m)   Wt 175 lb (79.4 kg)   SpO2 99%   BMI 28.25 kg/m   Visual Acuity Right Eye Distance:   Left Eye Distance:   Bilateral Distance:    Right Eye Near:   Left Eye Near:    Bilateral Near:     Physical Exam Vitals and nursing note reviewed.  Constitutional:      General: He is in acute distress.     Comments: In a lot of pain and had to be wheeled in the room  HENT:     Head: Normocephalic.     Right Ear: External ear normal.     Left Ear: External ear normal.  Eyes:     General: No scleral icterus.    Conjunctiva/sclera: Conjunctivae normal.  Pulmonary:     Effort: Pulmonary effort is normal.  Musculoskeletal:        General: Normal range of motion.      Cervical back: Neck supple.     Comments: BACK- has muscle tightness on mild and lower L lumbar region. Pain provoked with spine ROM which is limited due to pain. SLR is neg.   Skin:    General: Skin is warm and dry.     Findings: No bruising or rash.  Neurological:     Mental Status: He is alert and oriented to person, place, and time.     Motor: No weakness.     Gait: Gait normal.     Deep Tendon Reflexes: Reflexes normal.  Psychiatric:        Mood and Affect: Mood normal.        Behavior: Behavior normal.        Thought Content: Thought content normal.        Judgment: Judgment normal.     UC Treatments / Results  Labs (all labs ordered are listed, but only abnormal results are displayed) Labs Reviewed - No data to display  EKG   Radiology DG Lumbar Spine Complete  Result Date: 03/21/2021 CLINICAL DATA:  Severe low back pain after jumping from truck, pain on LEFT side of lower back and hip. Fever for 2 days by report. EXAM: LUMBAR SPINE - COMPLETE 4+ VIEW COMPARISON:  CT of the abdomen and pelvis from 2016. FINDINGS: Five lumbar type vertebral bodies. Vertebral body heights and disc spaces are well maintained aside from mild narrowing at the L3-4 and L5-S1 of disc space. There are small anterior osteophytes throughout the lumbar spine. Facet arthropathy, mild at L5-S1. No signs of static malalignment. IMPRESSION: Mild multilevel degenerative changes as described above. No acute osseous abnormality. Electronically Signed   By: 2017 M.D.   On: 03/21/2021 12:03    Procedures Procedures (including critical care time)  Medications Ordered in UC Medications  ketorolac (TORADOL) injection 60 mg (60 mg Intramuscular Given 03/21/21 1224)    Initial Impression / Assessment and Plan / UC Course  I have reviewed the triage vital signs and the nursing notes. Pertinent imaging results that were available during my care of the patient were reviewed by  me and considered in  my medical decision making (see chart for details). L lumbar strain with DDD Pt was given Toradol 60 mg IM here I sent tx for Flexeril, Prednisone and Tramadol as noted.    Final Clinical Impressions(s) / UC Diagnoses   Final diagnoses:  Lumbar spine strain, initial encounter   Discharge Instructions   None    ED Prescriptions     Medication Sig Dispense Auth. Provider   cyclobenzaprine (FLEXERIL) 10 MG tablet Take 1 tablet (10 mg total) by mouth 3 (three) times daily. 30 tablet Rodriguez-Southworth, Namya Voges, PA-C   traMADol (ULTRAM) 50 MG tablet Take 1 tablet (50 mg total) by mouth every 6 (six) hours as needed. 15 tablet Rodriguez-Southworth, Naithen [place]ivenburg, PA-C   predniSONE (STERAPRED UNI-PAK 21 TAB) 10 MG (21) TBPK tablet Take by mouth daily. Take 6 tabs by mouth daily  for 2 days, then 5 tabs for 2 days, then 4 tabs for 2 days, then 3 tabs for 2 days, 2 tabs for 2 days, then 1 tab by mouth daily for 2 days 42 tablet Rodriguez-Southworth, Nettie Elm, PA-C      I have reviewed the PDMP during this encounter.   Garey Ham, PA-C 03/21/21 1746    Rodriguez-Southworth, [place], PA-C 03/21/21 1746

## 2021-03-21 NOTE — ED Triage Notes (Signed)
Pt c/o of back pain on the left side near lower back and hip. Pt states he was working and jumped up to get to the back of the truck and that is when he thinks he hurt it. Pt c/o fever x 2 days ago. Taking ibuprofen every 4 hours

## 2021-07-06 ENCOUNTER — Other Ambulatory Visit: Payer: Self-pay

## 2021-07-06 ENCOUNTER — Ambulatory Visit
Admission: EM | Admit: 2021-07-06 | Discharge: 2021-07-06 | Disposition: A | Discharge status: Home / Self Care | Payer: Self-pay | Attending: Internal Medicine | Admitting: Internal Medicine

## 2021-07-06 DIAGNOSIS — M1612 Unilateral primary osteoarthritis, left hip: Secondary | ICD-10-CM | POA: Insufficient documentation

## 2021-07-06 DIAGNOSIS — N481 Balanitis: Secondary | ICD-10-CM | POA: Insufficient documentation

## 2021-07-06 LAB — URINALYSIS, MICROSCOPIC (REFLEX)

## 2021-07-06 LAB — URINALYSIS, ROUTINE W REFLEX MICROSCOPIC
Glucose, UA: 500 mg/dL — AB
Hgb urine dipstick: NEGATIVE
Ketones, ur: NEGATIVE mg/dL
Leukocytes,Ua: NEGATIVE
Nitrite: NEGATIVE
Protein, ur: 100 mg/dL — AB
Specific Gravity, Urine: >1.03 — ABNORMAL HIGH (ref 1.005–1.030)
pH: 5.5 (ref 5.0–8.0)

## 2021-07-06 MED ORDER — CLOTRIMAZOLE-BETAMETHASONE 1-0.05 % EX CREA: TOPICAL_CREAM | Freq: Two times a day (BID) | CUTANEOUS | 0 refills | Status: AC

## 2021-07-06 MED ORDER — CYCLOBENZAPRINE HCL 10 MG PO TABS: 10.0000 mg | ORAL_TABLET | Freq: Three times a day (TID) | ORAL | 0 refills | Status: DC | PRN

## 2021-07-06 MED ORDER — CYCLOBENZAPRINE HCL 10 MG PO TABS: 10.0000 mg | ORAL_TABLET | Freq: Three times a day (TID) | ORAL | 0 refills | Status: DC

## 2021-07-06 MED ORDER — MELOXICAM 7.5 MG PO TABS: 7.5000 mg | ORAL_TABLET | Freq: Every day | ORAL | 0 refills | Status: DC

## 2021-07-06 NOTE — Discharge Instructions (Addendum)
Please apply the cream to the affected area twice daily for 7 days If symptoms worsen please return to urgent care to be reevaluated

## 2021-07-06 NOTE — ED Triage Notes (Addendum)
Spoke to patient using Interpreter [employer] [phone]  Pt c/o itching and pain in groin x3days. Pt states that his urine is a deep yellow color and has an odor. x1day  Pt feels lumps along his groin in the crease between his legs x1day  Pt last had sex 1 week ago with his wife.  Pt wants a refill of Cyclobenzaprin 10mg 

## 2021-07-09 NOTE — ED Provider Notes (Signed)
[facility]    CSN: [id number] Arrival date & time: 07/06/21  1133      History   Chief Complaint Chief Complaint  Patient presents with   Penis Pain   Medication Refill    HPI Samuel Fleming is a 41 y.o. male comes to the urgent care with 3-day history of itchy rash on the penis and in the groin region.  Patient says symptoms have been persistent with some malodorous smell.  He has some itching sensation in the groin.  Patient is in a monogamous relationship.  Patient is uncircumcised.  No urethral discharge, dysuria or urgency.  Patient is also requesting refill on Flexeril.   HPI  History reviewed. No pertinent past medical history.  There are no problems to display for this patient.   Past Surgical History:  Procedure Laterality Date   NO PAST SURGERIES         Home Medications    Prior to Admission medications   Medication Sig Start Date End Date Taking? Authorizing Provider  clotrimazole-betamethasone (LOTRISONE) cream Apply topically 2 (two) times daily for 7 days. Apply to affected area 2 times daily prn 07/06/21 07/13/21 Yes Esvin Hnat, Britta Mccreedy, MD  meloxicam (MOBIC) 7.5 MG tablet Take 1 tablet (7.5 mg total) by mouth daily. 07/06/21  Yes Capucine Tryon, Britta Mccreedy, MD  cyclobenzaprine (FLEXERIL) 10 MG tablet Take 1 tablet (10 mg total) by mouth 3 (three) times daily as needed for muscle spasms. 07/06/21   Kahiau Schewe, Britta Mccreedy, MD    Family History Family History  Problem Relation Age of Onset   Healthy Mother    Prostate cancer Father     Social History Social History   Tobacco Use   Smoking status: Every Day    Packs/day: 0.60    Types: Cigarettes   Smokeless tobacco: Current  Vaping Use   Vaping Use: Never used  Substance Use Topics   Alcohol use: Not Currently    Comment: occasionally   Drug use: Not Currently    Types: Cocaine     Allergies   Patient has no known allergies.   Review of Systems Review of Systems   Constitutional: Negative.   Gastrointestinal: Negative.   Genitourinary:  Negative for penile discharge, penile pain and penile swelling.  Skin:  Positive for color change and rash. Negative for wound.    Physical Exam Triage Vital Signs ED Triage Vitals  Enc Vitals Group     BP 07/06/21 1238 111/73     Pulse Rate 07/06/21 1238 73     Resp 07/06/21 1238 18     Temp 07/06/21 1238 98.5 F (36.9 C)     Temp Source 07/06/21 1238 Oral     SpO2 07/06/21 1238 99 %     Weight 07/06/21 1234 185 lb (83.9 kg)     Height 07/06/21 1234 5' 8.9" (1.75 m)     Head Circumference --      Peak Flow --      Pain Score 07/06/21 1233 6     Pain Loc --      Pain Edu? --      Excl. in GC? --    No data found.  Updated Vital Signs BP 111/73 (BP Location: Left Arm)    Pulse 73    Temp 98.5 F (36.9 C) (Oral)    Resp 18    Ht 5' 8.9" (1.75 m)    Wt 83.9 kg    SpO2 99%  BMI 27.40 kg/m   Visual Acuity Right Eye Distance:   Left Eye Distance:   Bilateral Distance:    Right Eye Near:   Left Eye Near:    Bilateral Near:     Physical Exam Vitals and nursing note reviewed. Exam conducted with a chaperone present.  Constitutional:      General: He is not in acute distress.    Appearance: He is not ill-appearing.  Cardiovascular:     Rate and Rhythm: Normal rate and regular rhythm.  Genitourinary:    Comments: Irritation of the preputial skin as well as the head of the penis.  Mild discharge noted. Musculoskeletal:        General: Normal range of motion.  Neurological:     Mental Status: He is alert.     UC Treatments / Results  Labs (all labs ordered are listed, but only abnormal results are displayed) Labs Reviewed  URINALYSIS, ROUTINE W REFLEX MICROSCOPIC - Abnormal; Notable for the following components:      Result Value   Specific Gravity, Urine >1.030 (*)    Glucose, UA 500 (*)    Bilirubin Urine SMALL (*)    Protein, ur 100 (*)    All other components within normal limits   URINALYSIS, MICROSCOPIC (REFLEX) - Abnormal; Notable for the following components:   Bacteria, UA FEW (*)    All other components within normal limits    EKG   Radiology No results found.  Procedures Procedures (including critical care time)  Medications Ordered in UC Medications - No data to display  Initial Impression / Assessment and Plan / UC Course  I have reviewed the triage vital signs and the nursing notes.  Pertinent labs & imaging results that were available during my care of the patient were reviewed by me and considered in my medical decision making (see chart for details).     1.  Balanitis circinata: Lotrimin cream twice daily for 5 to 7 days Personal hygiene emphasized Return precautions given  2.  Chronic low back pain Flexeril refilled Meloxicam 7.5 mg orally daily Gentle range of motion exercises Return precautions given.  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 Provider   cyclobenzaprine (FLEXERIL) 10 MG tablet  (Status: Discontinued) Take 1 tablet (10 mg total) by mouth 3 (three) times daily. 30 tablet Daley Mooradian, Britta Mccreedy, MD   clotrimazole-betamethasone (LOTRISONE) cream Apply topically 2 (two) times daily for 7 days. Apply to affected area 2 times daily prn 15 g Pablo Stauffer, Britta Mccreedy, MD   meloxicam (MOBIC) 7.5 MG tablet Take 1 tablet (7.5 mg total) by mouth daily. 30 tablet Samiksha Pellicano, Britta Mccreedy, MD   cyclobenzaprine (FLEXERIL) 10 MG tablet Take 1 tablet (10 mg total) by mouth 3 (three) times daily as needed for muscle spasms. 30 tablet Randall Colden, Britta Mccreedy, MD      PDMP not reviewed this encounter.   Merrilee Jansky, MD 07/09/21 [phone]

## 2021-09-05 ENCOUNTER — Ambulatory Visit
Admission: EM | Admit: 2021-09-05 | Discharge: 2021-09-05 | Disposition: A | Discharge status: Home / Self Care | Payer: Self-pay

## 2021-09-05 DIAGNOSIS — M25552 Pain in left hip: Secondary | ICD-10-CM

## 2021-09-05 DIAGNOSIS — R42 Dizziness and giddiness: Secondary | ICD-10-CM

## 2021-09-05 DIAGNOSIS — R739 Hyperglycemia, unspecified: Secondary | ICD-10-CM

## 2021-09-05 DIAGNOSIS — G8929 Other chronic pain: Secondary | ICD-10-CM

## 2021-09-05 LAB — CBC WITH DIFFERENTIAL/PLATELET
Abs Immature Granulocytes: 0.07 10*3/uL (ref 0.00–0.07)
Basophils Absolute: 0 10*3/uL (ref 0.0–0.1)
Basophils Relative: 1 %
Eosinophils Absolute: 0.3 10*3/uL (ref 0.0–0.5)
Eosinophils Relative: 4 %
HCT: 45.9 % (ref 39.0–52.0)
Hemoglobin: 16.6 g/dL (ref 13.0–17.0)
Immature Granulocytes: 1 %
Lymphocytes Relative: 40 %
Lymphs Abs: 2.6 10*3/uL (ref 0.7–4.0)
MCH: 31.8 pg (ref 26.0–34.0)
MCHC: 36.2 g/dL — ABNORMAL HIGH (ref 30.0–36.0)
MCV: 87.9 fL (ref 80.0–100.0)
Monocytes Absolute: 0.5 10*3/uL (ref 0.1–1.0)
Monocytes Relative: 8 %
Neutro Abs: 3.1 10*3/uL (ref 1.7–7.7)
Neutrophils Relative %: 46 %
Platelets: 267 10*3/uL (ref 150–400)
RBC: 5.22 MIL/uL (ref 4.22–5.81)
RDW: 11.4 % — ABNORMAL LOW (ref 11.5–15.5)
WBC: 6.5 10*3/uL (ref 4.0–10.5)
nRBC: 0 % (ref 0.0–0.2)

## 2021-09-05 LAB — COMPREHENSIVE METABOLIC PANEL
ALT: 91 U/L — ABNORMAL HIGH (ref 0–44)
AST: 53 U/L — ABNORMAL HIGH (ref 15–41)
Albumin: 4.7 g/dL (ref 3.5–5.0)
Alkaline Phosphatase: 95 U/L (ref 38–126)
Anion gap: 9 (ref 5–15)
BUN: 16 mg/dL (ref 6–20)
CO2: 23 mmol/L (ref 22–32)
Calcium: 9.2 mg/dL (ref 8.9–10.3)
Chloride: 104 mmol/L (ref 98–111)
Creatinine, Ser: 0.75 mg/dL (ref 0.61–1.24)
GFR, Estimated: >60 mL/min (ref >60)
Glucose, Bld: 235 mg/dL — ABNORMAL HIGH (ref 70–99)
Potassium: 3.8 mmol/L (ref 3.5–5.1)
Sodium: 136 mmol/L (ref 135–145)
Total Bilirubin: 0.9 mg/dL (ref 0.3–1.2)
Total Protein: 8.2 g/dL — ABNORMAL HIGH (ref 6.5–8.1)

## 2021-09-05 LAB — HEMOGLOBIN A1C
Hgb A1c MFr Bld: 8.1 % — ABNORMAL HIGH (ref 4.8–5.6)
Mean Plasma Glucose: 185.77 mg/dL

## 2021-09-05 LAB — GLUCOSE, CAPILLARY: Glucose-Capillary: 235 mg/dL — ABNORMAL HIGH (ref 70–99)

## 2021-09-05 NOTE — Discharge Instructions (Addendum)
Tienes az?car en la sangre elevada. Necesita ver a un m?dico de atenci?n primaria. He hecho una cita para usted para el 24/08/2021 a las 3 pm. Deber?a intentar llegar a las 2:45. Si no puede hacer una cita, ll?melos para reprogramarla. Hemos dibujado laboratorios hoy para que tengan una l?nea de base. Pueden comenzar con medicamentos para la diabetes. Ahora debe disminuir la ingesta de az?car y carbohidratos y aumentar la ingesta de l?quidos. Si se siente m?s mareado o d?bil, vaya a la sala de emergencias. ? ?Contin?e tomando medicamentos antiinflamatorios seg?n sea necesario para el dolor de cadera. Tambi?n puede discutir cualquier inquietud sobre su dolor de cadera con su nuevo m?dico. ? ?[facility] at [facility], [address], [address]. Phone number: [phone] ? ? ? ?
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
## 2021-09-07 ENCOUNTER — Encounter: Payer: Self-pay | Admitting: Internal Medicine

## 2021-09-07 DIAGNOSIS — E118 Type 2 diabetes mellitus with unspecified complications: Secondary | ICD-10-CM | POA: Insufficient documentation

## 2021-09-07 HISTORY — DX: Type 2 diabetes mellitus with unspecified complications: E11.8

## 2021-09-10 ENCOUNTER — Encounter: Payer: Self-pay | Admitting: Internal Medicine

## 2021-09-10 ENCOUNTER — Ambulatory Visit (INDEPENDENT_AMBULATORY_CARE_PROVIDER_SITE_OTHER): Payer: Self-pay | Admitting: Internal Medicine

## 2021-09-10 ENCOUNTER — Ambulatory Visit: Admission: EM | Admit: 2021-09-10 | Discharge: 2021-09-10 | Discharge status: Against Medical Advice | Payer: Self-pay

## 2021-09-10 VITALS — BP 127/74 | Ht 68.9 in | Wt 194.0 lb

## 2021-09-10 DIAGNOSIS — H18623 Keratoconus, unstable, bilateral: Secondary | ICD-10-CM

## 2021-09-10 DIAGNOSIS — M179 Osteoarthritis of knee, unspecified: Secondary | ICD-10-CM | POA: Insufficient documentation

## 2021-09-10 DIAGNOSIS — G8929 Other chronic pain: Secondary | ICD-10-CM | POA: Insufficient documentation

## 2021-09-10 DIAGNOSIS — F17209 Nicotine dependence, unspecified, with unspecified nicotine-induced disorders: Secondary | ICD-10-CM

## 2021-09-10 DIAGNOSIS — M546 Pain in thoracic spine: Secondary | ICD-10-CM

## 2021-09-10 DIAGNOSIS — M542 Cervicalgia: Secondary | ICD-10-CM

## 2021-09-10 DIAGNOSIS — E118 Type 2 diabetes mellitus with unspecified complications: Secondary | ICD-10-CM

## 2021-09-10 MED ORDER — METHOCARBAMOL 500 MG PO TABS: 500.0000 mg | ORAL_TABLET | Freq: Four times a day (QID) | ORAL | 2 refills | Status: AC

## 2021-09-10 MED ORDER — METFORMIN HCL 500 MG PO TABS: 500.0000 mg | ORAL_TABLET | Freq: Every day | ORAL | 0 refills | Status: DC

## 2021-09-10 MED ORDER — DICLOFENAC SODIUM 75 MG PO TBEC: 75.0000 mg | DELAYED_RELEASE_TABLET | Freq: Two times a day (BID) | ORAL | 2 refills | Status: AC

## 2021-09-10 NOTE — Patient Instructions (Signed)
Take Voltaren in place of Meloxicam ? ?Take Robaxin in place of cyclobenzaprine ? ?Try heat to your back and neck twice a day. ?
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
## 2021-10-15 ENCOUNTER — Other Ambulatory Visit: Payer: Self-pay

## 2021-10-15 ENCOUNTER — Ambulatory Visit
Admission: EM | Admit: 2021-10-15 | Discharge: 2021-10-15 | Disposition: A | Discharge status: Home / Self Care | Payer: Self-pay | Attending: Student | Admitting: Student

## 2021-10-15 ENCOUNTER — Encounter: Payer: Self-pay | Admitting: Emergency Medicine

## 2021-10-15 DIAGNOSIS — E119 Type 2 diabetes mellitus without complications: Secondary | ICD-10-CM | POA: Insufficient documentation

## 2021-10-15 DIAGNOSIS — B3742 Candidal balanitis: Secondary | ICD-10-CM | POA: Insufficient documentation

## 2021-10-15 LAB — URINALYSIS, ROUTINE W REFLEX MICROSCOPIC
Bilirubin Urine: NEGATIVE
Glucose, UA: NEGATIVE mg/dL
Hgb urine dipstick: NEGATIVE
Ketones, ur: NEGATIVE mg/dL
Leukocytes,Ua: NEGATIVE
Nitrite: NEGATIVE
Protein, ur: NEGATIVE mg/dL
Specific Gravity, Urine: <1.005 — ABNORMAL LOW (ref 1.005–1.030)
pH: 5.5 (ref 5.0–8.0)

## 2021-10-15 LAB — GLUCOSE, CAPILLARY: Glucose-Capillary: 106 mg/dL — ABNORMAL HIGH (ref 70–99)

## 2021-10-15 MED ORDER — CLOTRIMAZOLE 1 % EX CREA: TOPICAL_CREAM | CUTANEOUS | 0 refills | Status: AC

## 2021-10-15 MED ORDER — FLUCONAZOLE 150 MG PO TABS: 150.0000 mg | ORAL_TABLET | Freq: Once | ORAL | 0 refills | Status: AC

## 2021-10-15 NOTE — ED Provider Notes (Signed)
[facility]    CSN: [id number] Arrival date & time: 10/15/21  1001      History   Chief Complaint Chief Complaint  Patient presents with  . Hyperglycemia  . Urinary Tract Infection    HPI Samuel Fleming is a 41 y.o. male.   Pleasant 41 year old male with a known history of diabetes presents today with concerns for an infection.  He states for the past 3 days it is felt like a internal pinching sensation intermittently inside his penis.  He denies any external symptoms.  He did have candidal balanitis in February, and states his symptoms feel similar.  He does have a history of diabetes, and does not check his blood sugars at home.  He is only on metformin.  Blood sugar in office today was 106.  He denies any discharge, rash, lesions.  He states it is slightly uncomfortable in his testicles, he denies any exposure to STIs and states he is monogamous with his wife only.  He has not tried any treatments for his symptoms.  He denies flank pain or fever.   Hyperglycemia Urinary Tract Infection Presenting symptoms: penile pain (itching)    Past Medical History:  Diagnosis Date  . Type II diabetes mellitus with complication (HCC) 09/07/2021    Patient Active Problem List   Diagnosis Date Noted  . OA (osteoarthritis) of knee 09/10/2021  . 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 Chronic bilateral thoracic back pain 09/10/2021  . Type II diabetes mellitus with complication (HCC) 09/07/2021    Past Surgical History:  Procedure Laterality Date  . NO PAST SURGERIES         Home Medications    Prior to Admission medications   Medication Sig Start Date End Date Taking? Authorizing Provider  clotrimazole (LOTRIMIN) 1 % cream Apply to affected area 2 times daily x 7 days 10/15/21  Yes Marcele Kosta L, PA  fluconazole (DIFLUCAN) 150 MG tablet Take 1 tablet (150 mg total) by mouth once for 1 dose. 10/15/21 10/15/21 Yes Amerie Beaumont L, PA  diclofenac  (VOLTAREN) 75 MG EC tablet Take 1 tablet (75 mg total) by mouth 2 (two) times daily. 09/10/21   Reubin Milan, MD  metFORMIN (GLUCOPHAGE) 500 MG tablet Take 1 tablet (500 mg total) by mouth daily with breakfast. 09/10/21   Reubin Milan, MD  methocarbamol (ROBAXIN) 500 MG tablet Take 1 tablet (500 mg total) by mouth 4 (four) times daily. 09/10/21   Reubin Milan, MD    Family History Family History  Problem Relation Age of Onset  . Diabetes Mellitus I Mother   . Prostate cancer Father     Social History Social History   Tobacco Use  . Smoking status: Every Day    Packs/day: 0.70    Years: 20.00    Pack years: 14.00    Types: Cigarettes  . Smokeless tobacco: Never  Vaping Use  . Vaping Use: Never used  Substance Use Topics  . Alcohol use: Not Currently    Comment: occasionally  . Drug use: Not Currently     Allergies   Patient has no known allergies.   Review of Systems Review of Systems  Genitourinary:  Positive for penile pain (itching).  All other systems reviewed and are negative.   Physical Exam Triage Vital Signs ED Triage Vitals  Enc Vitals Group     BP 10/15/21 1058 115/75     Pulse Rate 10/15/21 1058 60  Resp 10/15/21 1058 18     Temp 10/15/21 1058 98.2 F (36.8 C)     Temp Source 10/15/21 1058 Oral     SpO2 10/15/21 1058 98 %     Weight --      Height --      Head Circumference --      Peak Flow --      Pain Score 10/15/21 1054 7     Pain Loc --      Pain Edu? --      Excl. in GC? --    No data found.  Updated Vital Signs BP 115/75 (BP Location: Left Arm)   Pulse 60   Temp 98.2 F (36.8 C) (Oral)   Resp 18   SpO2 98%   Visual Acuity Right Eye Distance:   Left Eye Distance:   Bilateral Distance:    Right Eye Near:   Left Eye Near:    Bilateral Near:     Physical Exam   UC Treatments / Results  Labs (all labs ordered are listed, but only abnormal results are displayed) Labs Reviewed  GLUCOSE, CAPILLARY -  Abnormal; Notable for the following components:      Result Value   Glucose-Capillary 106 (*)    All other components within normal limits  URINALYSIS, ROUTINE W REFLEX MICROSCOPIC - Abnormal; Notable for the following components:   Specific Gravity, Urine <1.005 (*)    All other components within normal limits  CBG MONITORING, ED  CYTOLOGY, (ORAL, ANAL, URETHRAL) ANCILLARY ONLY    EKG   Radiology No results found.  Procedures Procedures (including critical care time)  Medications Ordered in UC Medications - No data to display  Initial Impression / Assessment and Plan / UC Course  I have reviewed the triage vital signs and the nursing notes.  Pertinent labs & imaging results that were available during my care of the patient were reviewed by me and considered in my medical decision making (see chart for details).     *** Final Clinical Impressions(s) / UC Diagnoses   Final diagnoses:  Candidal balanitis  Type 2 diabetes mellitus without complication, without long-term current use of insulin (HCC)     Discharge Instructions      Your blood sugar is 106 which is close to normal Your urine does not show signs of infection. You were tested today for gonorrhea, chlamydia and trichomonas. I would like you to start taking the medication for yeast. We will call with the results of your test sent out today.   ED Prescriptions     Medication Sig Dispense Auth. Provider   fluconazole (DIFLUCAN) 150 MG tablet Take 1 tablet (150 mg total) by mouth once for 1 dose. 1 tablet Munira Polson L, PA   clotrimazole (LOTRIMIN) 1 % cream Apply to affected area 2 times daily x 7 days 15 g Aquil Duhe L, PA      PDMP not reviewed this encounter.

## 2021-10-15 NOTE — ED Notes (Signed)
Notified laura, pa of cbg 106

## 2021-10-15 NOTE — ED Triage Notes (Signed)
Patient wants blood sugar checked .  Reports was seen here one month ago and since has been watching diet.    3 days ago noticed pain in groin, burning with urination.  Denies penile discharge

## 2021-10-15 NOTE — Discharge Instructions (Addendum)
Your blood sugar is 106 which is close to normal Your urine does not show signs of infection. You were tested today for gonorrhea, chlamydia and trichomonas. I would like you to start taking the medication for yeast. We will call with the results of your test sent out today.

## 2021-10-16 LAB — CYTOLOGY, (ORAL, ANAL, URETHRAL) ANCILLARY ONLY
Chlamydia: NEGATIVE
Comment: NEGATIVE
Comment: NEGATIVE
Comment: NORMAL
Neisseria Gonorrhea: NEGATIVE
Trichomonas: NEGATIVE

## 2021-12-13 ENCOUNTER — Ambulatory Visit: Payer: Self-pay | Admitting: Internal Medicine

## 2021-12-13 NOTE — Progress Notes (Deleted)
Date:  12/13/2021   Name:  Samuel Fleming   DOB:  Aug 13, 1980   MRN:  [id number]   Chief Complaint: No chief complaint on file.  Diabetes He presents for his follow-up diabetic visit. He has type 2 diabetes mellitus. Pertinent negatives for hypoglycemia include no headaches or tremors. Pertinent negatives for diabetes include no chest pain, no fatigue, no polydipsia and no polyuria. Current diabetic treatment includes oral agent (monotherapy) (started on metformin last visit). There is no compliance with monitoring of blood glucose.    Lab Results  Component Value Date   NA 136 09/05/2021   K 3.8 09/05/2021   CO2 23 09/05/2021   GLUCOSE 235 (H) 09/05/2021   BUN 16 09/05/2021   CREATININE 0.75 09/05/2021   CALCIUM 9.2 09/05/2021   GFRNONAA >60 09/05/2021   No results found for: "CHOL", "HDL", "LDLCALC", "LDLDIRECT", "TRIG", "CHOLHDL" No results found for: "TSH" Lab Results  Component Value Date   HGBA1C 8.1 (H) 09/05/2021   Lab Results  Component Value Date   WBC 6.5 09/05/2021   HGB 16.6 09/05/2021   HCT 45.9 09/05/2021   MCV 87.9 09/05/2021   PLT 267 09/05/2021   Lab Results  Component Value Date   ALT 91 (H) 09/05/2021   AST 53 (H) 09/05/2021   ALKPHOS 95 09/05/2021   BILITOT 0.9 09/05/2021   No results found for: "25OHVITD2", "25OHVITD3", "VD25OH"   Review of Systems  Constitutional:  Negative for appetite change, fatigue and unexpected weight change.  Eyes:  Negative for visual disturbance.  Respiratory:  Negative for cough, shortness of breath and wheezing.   Cardiovascular:  Negative for chest pain, palpitations and leg swelling.  Gastrointestinal:  Negative for abdominal pain and blood in stool.  Endocrine: Negative for polydipsia and polyuria.  Genitourinary:  Negative for dysuria and hematuria.  Skin:  Negative for color change and rash.  Neurological:  Negative for tremors, numbness and headaches.  Psychiatric/Behavioral:  Negative for dysphoric  mood.     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  No outpatient medications have been marked as taking for the 12/13/21 encounter (Appointment) with Reubin Milan, MD.       09/10/2021    3:21 PM  GAD 7 : Generalized Anxiety Score  Nervous, Anxious, on Edge 0  Control/stop worrying 0  Worry too much - different things 0  Trouble relaxing 0  Restless 0  Easily annoyed or irritable 0  Afraid - awful might happen 0  Total GAD 7 Score 0  Anxiety Difficulty Not difficult at all       09/10/2021    3:21 PM  Depression screen PHQ 2/9  Decreased Interest 0  Down, Depressed, Hopeless 0  PHQ - 2 Score 0  Altered sleeping 0  Tired, decreased energy 0  Change in appetite 0  Feeling bad or failure about yourself  0  Trouble concentrating 0  Moving slowly or fidgety/restless 0  Suicidal thoughts 0  PHQ-9 Score 0  Difficult doing work/chores Not difficult at all    BP Readings from Last 3 Encounters:  10/15/21 115/75  09/10/21 127/74  09/05/21 132/76    Physical Exam Vitals and nursing note reviewed.  Constitutional:      General: He is not in acute distress.    Appearance: He is well-developed.  HENT:     Head: Normocephalic and atraumatic.  Pulmonary:     Effort: Pulmonary effort is  normal. No respiratory distress.  Skin:    General: Skin is warm and dry.     Findings: No rash.  Neurological:     Mental Status: He is alert and oriented to person, place, and time.  Psychiatric:        Mood and Affect: Mood normal.        Behavior: Behavior normal.     Wt Readings from Last 3 Encounters:  09/10/21 194 lb (88 kg)  09/05/21 184 lb 15.5 oz (83.9 kg)  07/06/21 185 lb (83.9 kg)    There were no vitals taken for this visit.  Assessment and Plan:

## 2021-12-20 ENCOUNTER — Encounter: Payer: Self-pay | Admitting: Internal Medicine

## 2022-03-30 ENCOUNTER — Ambulatory Visit
Admission: EM | Admit: 2022-03-30 | Discharge: 2022-03-30 | Disposition: A | Discharge status: Home / Self Care | Payer: Self-pay | Attending: Internal Medicine | Admitting: Internal Medicine

## 2022-03-30 ENCOUNTER — Encounter: Payer: Self-pay | Admitting: Emergency Medicine

## 2022-03-30 DIAGNOSIS — R112 Nausea with vomiting, unspecified: Secondary | ICD-10-CM

## 2022-03-30 DIAGNOSIS — H8309 Labyrinthitis, unspecified ear: Secondary | ICD-10-CM

## 2022-03-30 DIAGNOSIS — R197 Diarrhea, unspecified: Secondary | ICD-10-CM

## 2022-03-30 LAB — GLUCOSE, CAPILLARY: Glucose-Capillary: 97 mg/dL (ref 70–99)

## 2022-03-30 MED ORDER — ONDANSETRON 4 MG PO TBDP
4.0000 mg | ORAL_TABLET | Freq: Three times a day (TID) | ORAL | 0 refills | Status: AC | PRN
Start: 2022-03-30 — End: ?

## 2022-03-30 MED ORDER — ONDANSETRON 8 MG PO TBDP
8.0000 mg | ORAL_TABLET | Freq: Once | ORAL | Status: AC
  Administered 2022-03-30: 8 mg via ORAL

## 2022-03-30 MED ORDER — MECLIZINE HCL 25 MG PO TABS: 25.0000 mg | ORAL_TABLET | Freq: Three times a day (TID) | ORAL | 0 refills | Status: AC | PRN

## 2022-03-30 NOTE — ED Provider Notes (Signed)
[facility]    CSN: [id number] Arrival date & time: 03/30/22  0908      History   Chief Complaint Chief Complaint  Patient presents with   Nausea   Emesis    HPI Samuel Fleming is a 41 y.o. male who presents due to developing Nausea and dizziness described as feeling off balance. States he had a mild cold last week, but has resolved. For the past few nights has been getting hot and thinks maybe had a fever. Was diagnosed with DM 1 month ago and was placed on Metformin, but he stopped it since he felt was losing too much weight. He placed himself of very low carb diet and avoids sugars.     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 Authorizing Provider  meclizine (ANTIVERT) 25 MG tablet Take 1 tablet (25 mg total) by mouth 3 (three) times daily as needed for dizziness. 03/30/22  Yes Rodriguez-Southworth, Nettie Elm, PA-C  ondansetron (ZOFRAN-ODT) 4 MG disintegrating tablet Take 1 tablet (4 mg total) by mouth every 8 (eight) hours as needed for nausea or vomiting. 03/30/22  Yes Rodriguez-Southworth, Nettie Elm, PA-C  clotrimazole (LOTRIMIN) 1 % cream Apply to affected area 2 times daily x 7 days 10/15/21   Guy Sandifer L, PA  diclofenac (VOLTAREN) 75 MG EC tablet Take 1 tablet (75 mg total) by mouth 2 (two) times daily. 09/10/21   Reubin Milan, MD  methocarbamol (ROBAXIN) 500 MG tablet Take 1 tablet (500 mg total) by mouth 4 (four) times daily. 09/10/21   Reubin Milan,  MD    Family History Family History  Problem Relation Age of Onset   Diabetes Mellitus I Mother    Prostate cancer Father     Social History Social History   Tobacco Use   Smoking status: Every Day    Packs/day: 0.70    Years: 20.00    Total pack years: 14.00    Types: Cigarettes   Smokeless tobacco: Never  Vaping Use   Vaping Use: Never used  Substance Use Topics   Alcohol use: Not Currently    Comment: occasionally   Drug use: Not Currently     Allergies   Patient has no known allergies.   Review of Systems Review of Systems  Constitutional:  Positive for appetite change. Negative for chills, diaphoresis and fever.  HENT:  Negative for congestion, ear discharge, ear pain, postnasal drip, rhinorrhea and sore throat.   Eyes:  Negative for discharge.  Respiratory:  Negative for cough.   Cardiovascular:  Negative for chest pain.  Gastrointestinal:  Positive for nausea. Negative for abdominal pain, diarrhea and vomiting.  Skin:  Negative for rash.  Neurological:  Positive for dizziness. Negative for headaches.  Hematological:  Negative for adenopathy.     Physical Exam Triage Vital Signs ED Triage Vitals  Enc Vitals Group     BP 03/30/22 0934 124/80     Pulse Rate 03/30/22 0934  61     Resp 03/30/22 0934 16     Temp 03/30/22 0934 98.8 F (37.1 C)     Temp Source 03/30/22 0934 Oral     SpO2 03/30/22 0934 96 %     Weight 03/30/22 0938 194 lb 0.1 oz (88 kg)     Height 03/30/22 0938 5\' 8"  (1.727 m)     Head Circumference --      Peak Flow --      Pain Score 03/30/22 0937 0     Pain Loc --      Pain Edu? --      Excl. in GC? --    No data found.  Updated Vital Signs BP 124/80 (BP Location: Right Arm)   Pulse 61   Temp 98.8 F (37.1 C) (Oral)   Resp 16   Ht 5\' 8"  (1.727 m)   Wt 194 lb 0.1 oz (88 kg)   SpO2 96%   BMI 29.50 kg/m   Visual Acuity Right Eye Distance:   Left Eye Distance:   Bilateral Distance:    Right Eye Near:   Left Eye Near:     Bilateral Near:     Physical Exam Vitals and nursing note reviewed.  Constitutional:      General: He is not in acute distress.    Appearance: He is not toxic-appearing.  HENT:     Right Ear: External ear normal.     Left Ear: Tympanic membrane, ear canal and external ear normal.     Ears:     Comments: R TM is dull and flat    Mouth/Throat:     Mouth: Mucous membranes are moist.  Eyes:     General: No scleral icterus.    Conjunctiva/sclera: Conjunctivae normal.  Cardiovascular:     Rate and Rhythm: Normal rate and regular rhythm.     Pulses: Normal pulses.     Heart sounds: No murmur heard. Pulmonary:     Effort: Pulmonary effort is normal.     Breath sounds: Normal breath sounds.  Musculoskeletal:        General: Normal range of motion.     Cervical back: Neck supple.  Lymphadenopathy:     Cervical: No cervical adenopathy.  Skin:    General: Skin is warm.  Neurological:     Mental Status: He is alert and oriented to person, place, and time.     Cranial Nerves: No cranial nerve deficit.     Motor: No weakness.     Coordination: Coordination normal.     Gait: Gait normal.     Deep Tendon Reflexes: Reflexes normal.     Comments: Romberg, tandem gait are normal.   Psychiatric:        Mood and Affect: Mood normal.        Behavior: Behavior normal.        Thought Content: Thought content normal.        Judgment: Judgment normal.      UC Treatments / Results  Labs (all labs ordered are listed, but only abnormal results are displayed) Labs Reviewed  GLUCOSE, CAPILLARY  CBG MONITORING, ED    EKG   Radiology No results found.  Procedures Procedures (including critical care time)  Medications Ordered in UC Medications  ondansetron (ZOFRAN-ODT) disintegrating tablet 8 mg (8 mg Oral Given 03/30/22 1017)    Initial Impression / Assessment and Plan / UC Course  I have reviewed the triage vital signs and the nursing notes.  Pertinent labs  results that  were available during my care of the patient were reviewed by me and considered in my medical decision making (see chart for details). He was given Zofran ODT 8 mg here, was given 12 oz of water and was abe to keep it down and his nausea resolved.   Middle ear disorder and nausea DM resolved with diet  I placed him on Meclezine and Zofran as noted.    Final Clinical Impressions(s) / UC Diagnoses  Middle ear disorder and nausea DM resolved with diet Final diagnoses:  Labyrinthitis, unspecified laterality  Nausea vomiting and diarrhea   Discharge Instructions   None    ED Prescriptions     Medication Sig Dispense Auth. Provider   meclizine (ANTIVERT) 25 MG tablet Take 1 tablet (25 mg total) by mouth 3 (three) times daily as needed for dizziness. 30 tablet Rodriguez-Southworth, Jhanae Jaskowiak, PA-C   ondansetron (ZOFRAN-ODT) 4 MG disintegrating tablet Take 1 tablet (4 mg total) by mouth every 8 (eight) hours as needed for nausea or vomiting. 20 tablet Rodriguez-Southworth, Nettie Elm, PA-C      PDMP not reviewed this encounter.   Garey Ham, PA-C 03/30/22 1058

## 2022-03-30 NOTE — ED Triage Notes (Signed)
Pt c/o nausea, vomiting, dizziness, and subjective fever. Started about 3 days ago. Denies abdominal pain, diarrhea.   Interpreter Pamelia Hoit [phone]

## 2022-09-12 ENCOUNTER — Encounter: Payer: Self-pay | Admitting: Family Medicine

## 2022-09-12 ENCOUNTER — Ambulatory Visit: Payer: Self-pay | Admitting: Family Medicine

## 2022-09-12 DIAGNOSIS — N481 Balanitis: Secondary | ICD-10-CM | POA: Insufficient documentation

## 2022-09-12 DIAGNOSIS — Z113 Encounter for screening for infections with a predominantly sexual mode of transmission: Secondary | ICD-10-CM

## 2022-09-12 LAB — GRAM STAIN

## 2022-09-12 MED ORDER — CLOTRIMAZOLE-BETAMETHASONE 1-0.05 % EX CREA
1.0000 | TOPICAL_CREAM | Freq: Two times a day (BID) | CUTANEOUS | 0 refills | Status: AC
Start: 2022-09-12 — End: 2022-09-26

## 2022-09-12 NOTE — Progress Notes (Signed)
[facility] Department STI clinic/screening visit  Subjective:  Samuel Fleming is a 42 y.o. male being seen today for an STI screening visit. The patient reports they do have symptoms.    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 No  Screening for MPX risk: Does the patient have an unexplained rash? No Is the patient MSM? No Does the patient endorse multiple sex partners or anonymous sex partners? No Did the patient have close or sexual contact with a person diagnosed with MPX? No Has the patient traveled outside the [place] where MPX is endemic? No Is there a high clinical suspicion for MPX-- evidenced by one of the following No  -Unlikely to be chickenpox  -Lymphadenopathy  -Rash that present in same phase of evolution on any given body part   See flowsheet for further details and programmatic requirements.   Immunization History  Administered Date(s) Administered   [employer] Vaccination 07/19/2019, 08/08/2019     The following portions of the patient's history were reviewed and updated as appropriate: allergies, current medications, past medical history, past social history, past surgical history and problem  list.  Objective:  There were no vitals filed for this visit.  Physical Exam Constitutional:      Appearance: Normal appearance.  HENT:     Head: Normocephalic and atraumatic.     Comments: No nits or hair loss    Mouth/Throat:     Mouth: Mucous membranes are moist. No oral lesions.     Pharynx: Oropharynx is clear. No oropharyngeal exudate or posterior oropharyngeal erythema.  Eyes:     General:        Right eye: No discharge.        Left eye: No discharge.     Conjunctiva/sclera:     Right eye: Right conjunctiva is not injected. No exudate.    Left eye: Left conjunctiva is not injected. No exudate. Pulmonary:     Effort: Pulmonary effort is normal.  Abdominal:     General: Abdomen is flat.     Palpations: Abdomen is soft. There is no hepatomegaly or mass.     Tenderness: There is no abdominal tenderness. There is no rebound.     Hernia: There is no hernia in the left inguinal area or right inguinal area.  Genitourinary:    Pubic Area: No rash or pubic lice (no nits).      Penis: Uncircumcised. Erythema present. No tenderness, discharge, swelling or lesions.      Testes: Normal.     Epididymis:     Right: Normal. No mass or tenderness.     Left: Normal. No mass or tenderness.     Rectum: Normal. No tenderness (  no lesions or discharge).     Comments: Penile Discharge Amount: none Color:  none  -redness at head of penis Lymphadenopathy:     Head:     Right side of head: No preauricular or posterior auricular adenopathy.     Left side of head: No preauricular or posterior auricular adenopathy.     Cervical: No cervical adenopathy.     Upper Body:     Right upper body: No supraclavicular, axillary or epitrochlear adenopathy.     Left upper body: No supraclavicular, axillary or epitrochlear adenopathy.     Lower Body: No right inguinal adenopathy. No left inguinal adenopathy.  Skin:    General: Skin is warm and dry.     Findings: No lesion or rash.  Neurological:      Mental Status: He is alert and oriented to person, place, and time.    Assessment and Plan:  Chistian Fleming is a 42 y.o. male presenting to the [facility] Department for STI screening  1. Screening for venereal disease  - Chlamydia/GC NAA, Confirmation - Gram stain  2. Balanitis Hx of DM2- stopped taking meds because he says it didn't make him feel better.  -Counseled that uncontrolled DM can lead to repeat yeast infection due to increase BS.  -given PCP list- encouraged to go to PCP  - clotrimazole-betamethasone (LOTRISONE) cream; Apply 1 Application topically 2 (two) times daily for 14 days.  Dispense: 30 g; Refill: 0  Patient does have STI symptoms Patient accepted all screenings including  urine GC/Chlamydia, and blood work for HIV/Syphilis. Patient meets criteria for HepB screening? Yes. Ordered? declined Patient meets criteria for HepC screening? No. Ordered? not applicable Recommended condom use with all sex Discussed importance of condom use for STI prevent  Treat positive test results per standing order. Discussed time line for [facility] results and that patient will be called with positive results and encouraged patient to call if he had not heard in 2 weeks Recommended repeat testing in 3 months with positive results. Recommended returning for continued or worsening symptoms.   Return if symptoms worsen or fail to improve, for STI screening.  No future appointments. Due to language barrier, interpreter Marlene Yemen was present for this visit.  Lenice Llamas, [place]

## 2022-09-12 NOTE — Progress Notes (Signed)
Pt is here for STD screening.  The patient was dispensed clotrimazole-betamethasone (LOTRISONE) cream  today. I provided counseling today regarding the medication. We discussed the medication, the side effects and when to call clinic. Patient given the opportunity to ask questions. Questions answered.   Condoms given.  Berdie Ogren, RN

## 2022-09-16 LAB — CHLAMYDIA/GC NAA, CONFIRMATION
Chlamydia trachomatis, NAA: NEGATIVE
Neisseria gonorrhoeae, NAA: NEGATIVE

## 2023-01-15 IMAGING — CR DG LUMBAR SPINE COMPLETE 4+V
5 series · 5 of 5 positions shown · non-contrast
Comparison: CT of the abdomen and pelvis from 7254.

CLINICAL DATA: Severe low back pain after jumping from truck, pain
on LEFT side of lower back and hip. Fever for 2 days by report.

EXAM:
LUMBAR SPINE - COMPLETE 4+ VIEW

[l-spine ap]
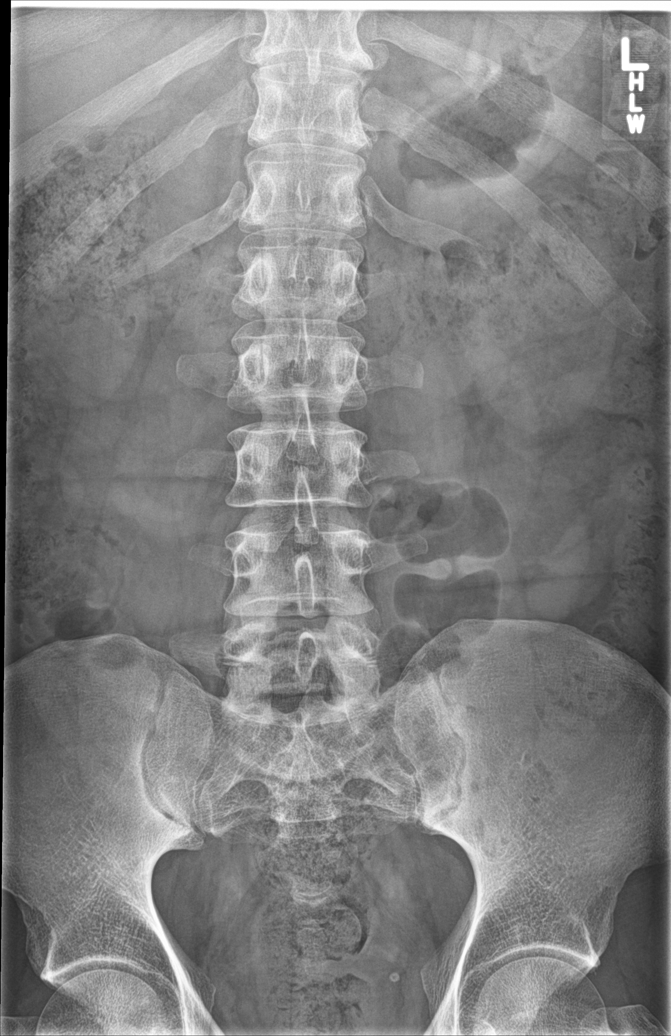

[l-spine obl (1 of 2)]
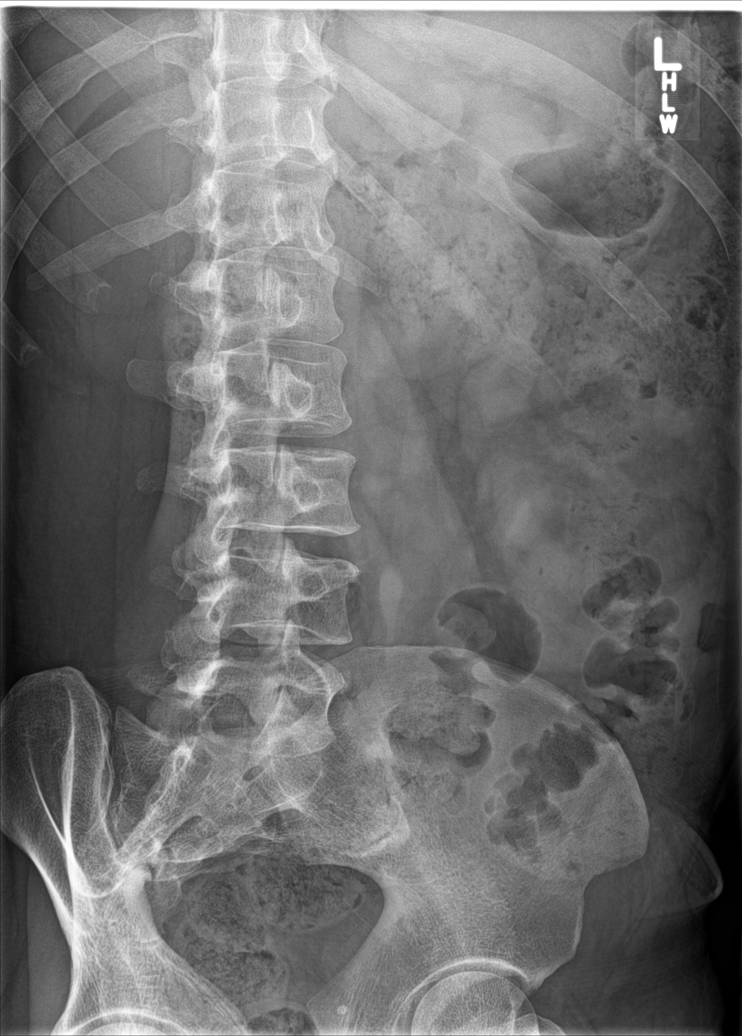

[l-spine obl (2 of 2)]
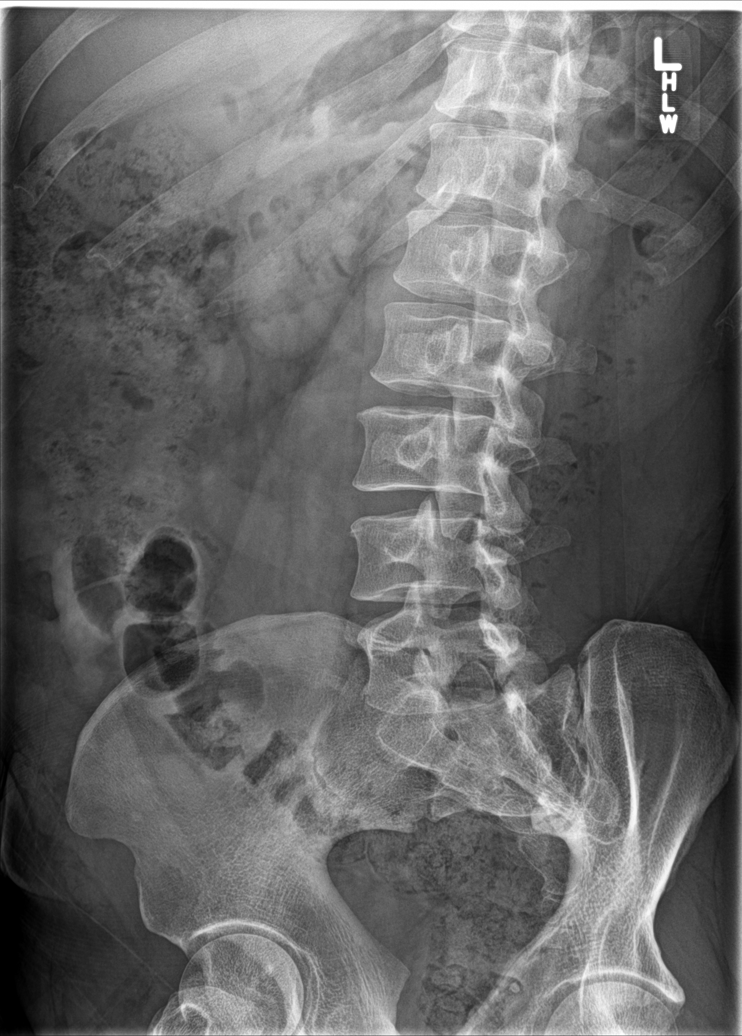

[l-spine lat]
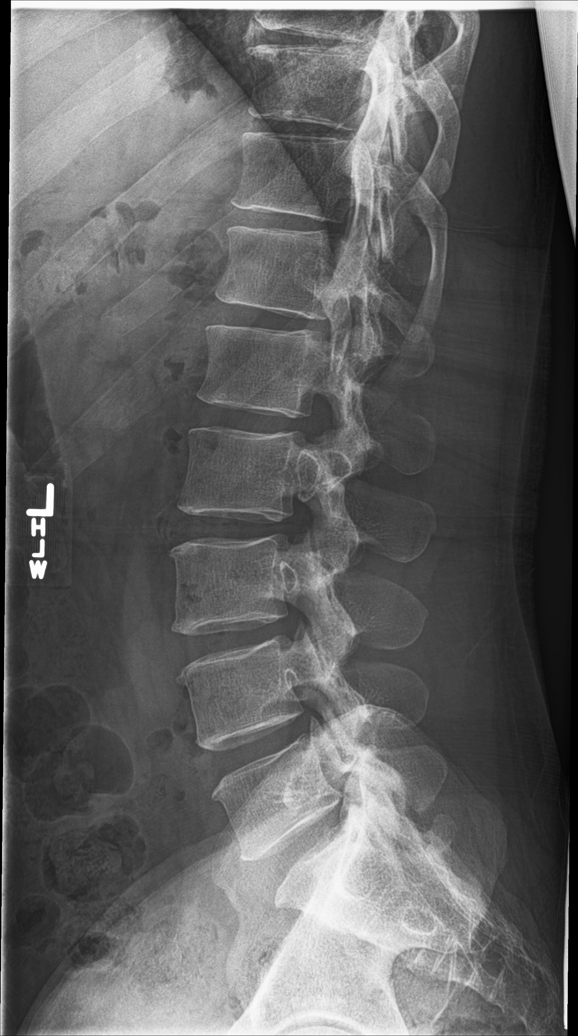

[l-spine spot]
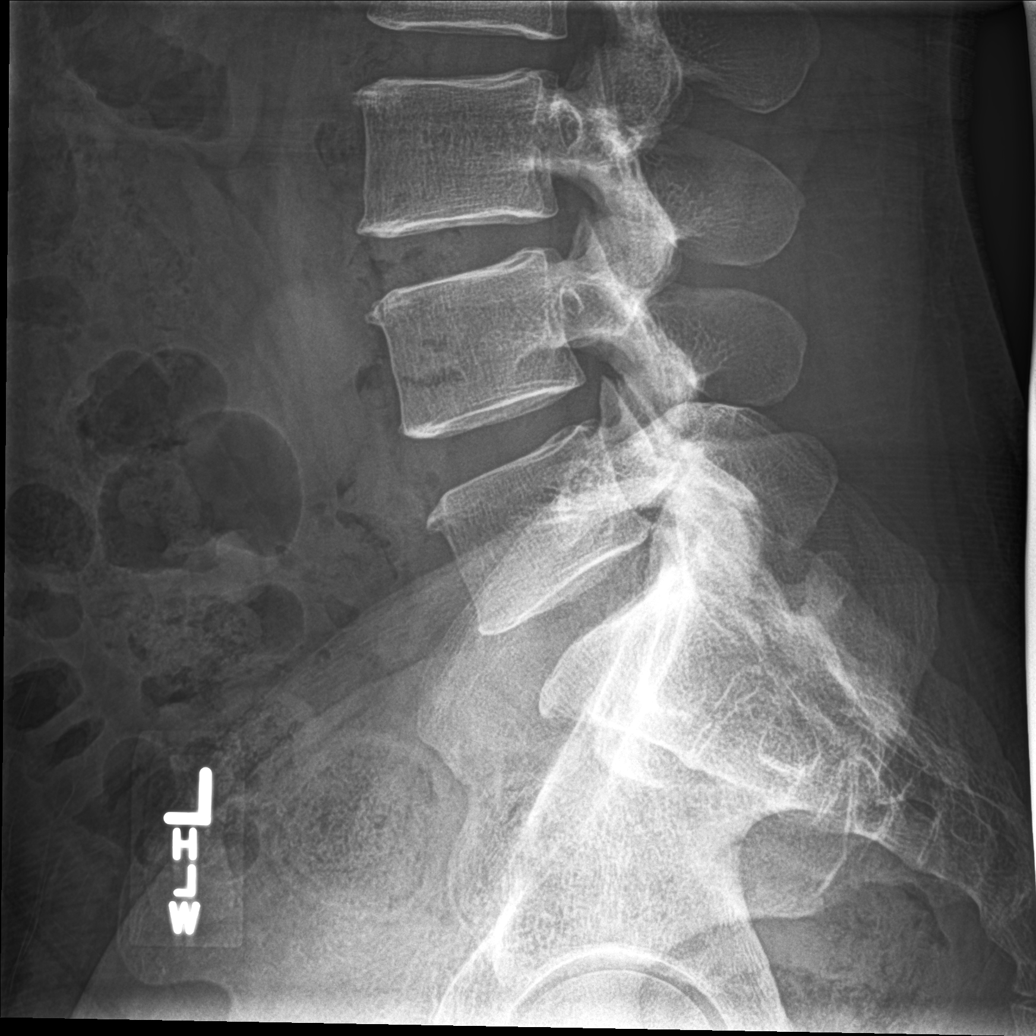

[5 of 5 positions shown; findings below may reference images not displayed]

FINDINGS: Five lumbar type vertebral bodies. Vertebral body heights and disc
spaces are well maintained aside from mild narrowing at the L3-4 and
L5-S1 of disc space. There are small anterior osteophytes throughout
the lumbar spine. Facet arthropathy, mild at L5-S1.

No signs of static malalignment.
IMPRESSION: Mild multilevel degenerative changes as described above. No acute
osseous abnormality.
